# Patient Record
Sex: Female | Born: 1945 | Race: White | Hispanic: No | Marital: Married | Smoking: Former smoker
Health system: Southern US, Community
[De-identification: ages and names within clinical notes are randomized; demographics above are authoritative.]

## PROBLEM LIST (undated history)

## (undated) DIAGNOSIS — E785 Hyperlipidemia, unspecified: Secondary | ICD-10-CM

## (undated) DIAGNOSIS — Z9221 Personal history of antineoplastic chemotherapy: Secondary | ICD-10-CM

## (undated) DIAGNOSIS — C50919 Malignant neoplasm of unspecified site of unspecified female breast: Secondary | ICD-10-CM

## (undated) DIAGNOSIS — Z923 Personal history of irradiation: Secondary | ICD-10-CM

## (undated) DIAGNOSIS — IMO0002 Reserved for concepts with insufficient information to code with codable children: Secondary | ICD-10-CM

## (undated) DIAGNOSIS — K219 Gastro-esophageal reflux disease without esophagitis: Secondary | ICD-10-CM

## (undated) DIAGNOSIS — K259 Gastric ulcer, unspecified as acute or chronic, without hemorrhage or perforation: Secondary | ICD-10-CM

## (undated) DIAGNOSIS — E039 Hypothyroidism, unspecified: Secondary | ICD-10-CM

## (undated) HISTORY — PX: COLONOSCOPY: SHX174

## (undated) HISTORY — PX: CHOLECYSTECTOMY: SHX55

## (undated) HISTORY — PX: HEMORRHOID SURGERY: SHX153

## (undated) HISTORY — PX: ESOPHAGOGASTRODUODENOSCOPY: SHX1529

## (undated) HISTORY — PX: BREAST LUMPECTOMY: SHX2

## (undated) HISTORY — PX: CARPAL TUNNEL RELEASE: SHX101

## (undated) HISTORY — PX: WRIST FRACTURE SURGERY: SHX121

---

## 1999-10-29 DIAGNOSIS — C50919 Malignant neoplasm of unspecified site of unspecified female breast: Secondary | ICD-10-CM

## 1999-10-29 DIAGNOSIS — Z9221 Personal history of antineoplastic chemotherapy: Secondary | ICD-10-CM

## 1999-10-29 DIAGNOSIS — IMO0001 Reserved for inherently not codable concepts without codable children: Secondary | ICD-10-CM

## 1999-10-29 HISTORY — DX: Personal history of antineoplastic chemotherapy: Z92.21

## 1999-10-29 HISTORY — DX: Malignant neoplasm of unspecified site of unspecified female breast: C50.919

## 1999-10-29 HISTORY — DX: Reserved for inherently not codable concepts without codable children: IMO0001

## 2004-07-28 ENCOUNTER — Ambulatory Visit: Payer: Self-pay | Admitting: Oncology

## 2004-08-03 ENCOUNTER — Ambulatory Visit: Payer: Self-pay | Admitting: Gastroenterology

## 2004-08-10 ENCOUNTER — Ambulatory Visit: Payer: Self-pay | Admitting: General Surgery

## 2004-08-23 ENCOUNTER — Ambulatory Visit: Payer: Self-pay | Admitting: General Surgery

## 2004-10-01 ENCOUNTER — Ambulatory Visit: Payer: Self-pay | Admitting: Internal Medicine

## 2004-10-11 ENCOUNTER — Ambulatory Visit: Payer: Self-pay | Admitting: Internal Medicine

## 2009-02-15 ENCOUNTER — Ambulatory Visit: Payer: Self-pay | Admitting: Internal Medicine

## 2009-03-16 ENCOUNTER — Emergency Department: Payer: Self-pay | Admitting: Emergency Medicine

## 2010-06-19 ENCOUNTER — Ambulatory Visit: Payer: Self-pay | Admitting: Internal Medicine

## 2011-11-20 ENCOUNTER — Encounter: Payer: Self-pay | Admitting: Physician Assistant

## 2011-11-29 ENCOUNTER — Encounter: Payer: Self-pay | Admitting: Physician Assistant

## 2011-12-27 ENCOUNTER — Encounter: Payer: Self-pay | Admitting: Physician Assistant

## 2012-01-08 ENCOUNTER — Ambulatory Visit: Payer: Self-pay | Admitting: Unknown Physician Specialty

## 2012-06-15 ENCOUNTER — Ambulatory Visit: Payer: Self-pay | Admitting: Unknown Physician Specialty

## 2012-10-27 ENCOUNTER — Ambulatory Visit: Payer: Self-pay | Admitting: Family Medicine

## 2013-04-08 ENCOUNTER — Ambulatory Visit: Payer: Self-pay | Admitting: Unknown Physician Specialty

## 2013-09-06 ENCOUNTER — Inpatient Hospital Stay: Payer: Self-pay | Admitting: Family Medicine

## 2013-09-06 LAB — IRON AND TIBC
Iron Bind.Cap.(Total): 329 ug/dL (ref 250–450)
Iron Saturation: 45 %
Iron: 147 ug/dL (ref 50–170)
Unbound Iron-Bind.Cap.: 182 ug/dL

## 2013-09-06 LAB — CBC
HCT: 27.8 % — ABNORMAL LOW (ref 35.0–47.0)
MCV: 88 fL (ref 80–100)
Platelet: 389 10*3/uL (ref 150–440)
RBC: 3.16 10*6/uL — ABNORMAL LOW (ref 3.80–5.20)
RDW: 13 % (ref 11.5–14.5)
WBC: 21.2 10*3/uL — ABNORMAL HIGH (ref 3.6–11.0)

## 2013-09-06 LAB — URINALYSIS, COMPLETE
Bilirubin,UR: NEGATIVE
Glucose,UR: NEGATIVE mg/dL (ref 0–75)
Ketone: NEGATIVE
Nitrite: NEGATIVE
Ph: 5 (ref 4.5–8.0)
RBC,UR: 5 /HPF (ref 0–5)
Specific Gravity: 1.023 (ref 1.003–1.030)
Squamous Epithelial: <1
WBC UR: 8 /HPF (ref 0–5)

## 2013-09-06 LAB — COMPREHENSIVE METABOLIC PANEL
Albumin: 3.3 g/dL — ABNORMAL LOW (ref 3.4–5.0)
Anion Gap: 5 — ABNORMAL LOW (ref 7–16)
Bilirubin,Total: 0.1 mg/dL — ABNORMAL LOW (ref 0.2–1.0)
Calcium, Total: 9.6 mg/dL (ref 8.5–10.1)
Chloride: 109 mmol/L — ABNORMAL HIGH (ref 98–107)
Co2: 23 mmol/L (ref 21–32)
EGFR (African American): >60
EGFR (Non-African Amer.): >60
SGOT(AST): 21 U/L (ref 15–37)
Total Protein: 6.2 g/dL — ABNORMAL LOW (ref 6.4–8.2)

## 2013-09-06 LAB — RETICULOCYTES: Absolute Retic Count: 0.1042 10*6/uL (ref 0.019–0.186)

## 2013-09-06 LAB — HEMOGLOBIN: HGB: 7.3 g/dL — ABNORMAL LOW (ref 12.0–16.0)

## 2013-09-07 LAB — BASIC METABOLIC PANEL
Anion Gap: 6 — ABNORMAL LOW (ref 7–16)
BUN: 18 mg/dL (ref 7–18)
Calcium, Total: 8.5 mg/dL (ref 8.5–10.1)
Chloride: 113 mmol/L — ABNORMAL HIGH (ref 98–107)
EGFR (African American): >60
Glucose: 92 mg/dL (ref 65–99)
Osmolality: 285 (ref 275–301)
Potassium: 3.5 mmol/L (ref 3.5–5.1)

## 2013-09-07 LAB — CBC WITH DIFFERENTIAL/PLATELET
Basophil #: 0.1 10*3/uL (ref 0.0–0.1)
Basophil %: 0.7 %
Basophil %: 0.5 %
Eosinophil #: 0.1 10*3/uL (ref 0.0–0.7)
Eosinophil #: 0 10*3/uL (ref 0.0–0.7)
Eosinophil %: 0.3 %
Eosinophil %: 0.3 %
HCT: 20.7 % — ABNORMAL LOW (ref 35.0–47.0)
HGB: 7 g/dL — ABNORMAL LOW (ref 12.0–16.0)
Lymphocyte #: 5.1 10*3/uL — ABNORMAL HIGH (ref 1.0–3.6)
Lymphocyte %: 25.4 %
Lymphocyte %: 30.2 %
MCH: 29.5 pg (ref 26.0–34.0)
MCH: 30.2 pg (ref 26.0–34.0)
MCHC: 34.2 g/dL (ref 32.0–36.0)
MCHC: 33.9 g/dL (ref 32.0–36.0)
Monocyte #: 1.3 x10 3/mm — ABNORMAL HIGH (ref 0.2–0.9)
Neutrophil #: 13.4 10*3/uL — ABNORMAL HIGH (ref 1.4–6.5)
Neutrophil #: 10.1 10*3/uL — ABNORMAL HIGH (ref 1.4–6.5)
Neutrophil %: 67.1 %
Neutrophil %: 63.9 %
Platelet: 212 10*3/uL (ref 150–440)
RBC: 2.32 10*6/uL — ABNORMAL LOW (ref 3.80–5.20)
RDW: 13.3 % (ref 11.5–14.5)
WBC: 20 10*3/uL — ABNORMAL HIGH (ref 3.6–11.0)
WBC: 15.8 10*3/uL — ABNORMAL HIGH (ref 3.6–11.0)

## 2013-09-07 LAB — HEMOGLOBIN: HGB: 7.7 g/dL — ABNORMAL LOW (ref 12.0–16.0)

## 2013-09-08 LAB — CBC WITH DIFFERENTIAL/PLATELET
Basophil #: 0 10*3/uL (ref 0.0–0.1)
Basophil %: 0.4 %
Eosinophil #: 0.2 10*3/uL (ref 0.0–0.7)
HCT: 20.5 % — ABNORMAL LOW (ref 35.0–47.0)
HGB: 7.4 g/dL — ABNORMAL LOW (ref 12.0–16.0)
MCHC: 36 g/dL (ref 32.0–36.0)
Monocyte #: 0.4 x10 3/mm (ref 0.2–0.9)
Neutrophil #: 5.7 10*3/uL (ref 1.4–6.5)
Neutrophil %: 55 %

## 2013-09-08 LAB — BASIC METABOLIC PANEL
BUN: 8 mg/dL (ref 7–18)
Calcium, Total: 8.4 mg/dL — ABNORMAL LOW (ref 8.5–10.1)
Chloride: 110 mmol/L — ABNORMAL HIGH (ref 98–107)
Co2: 25 mmol/L (ref 21–32)
Creatinine: 0.57 mg/dL — ABNORMAL LOW (ref 0.60–1.30)
EGFR (African American): >60
Glucose: 93 mg/dL (ref 65–99)
Osmolality: 279 (ref 275–301)
Potassium: 3.3 mmol/L — ABNORMAL LOW (ref 3.5–5.1)
Sodium: 141 mmol/L (ref 136–145)

## 2013-09-08 LAB — HEMOGLOBIN: HGB: 7.7 g/dL — ABNORMAL LOW (ref 12.0–16.0)

## 2013-09-09 LAB — CBC WITH DIFFERENTIAL/PLATELET
Basophil %: 0.5 %
Eosinophil #: 0.2 10*3/uL (ref 0.0–0.7)
Eosinophil %: 1.9 %
Lymphocyte #: 2.7 10*3/uL (ref 1.0–3.6)
Lymphocyte %: 26.4 %
MCV: 89 fL (ref 80–100)
Monocyte %: 6 %
Neutrophil %: 65.2 %
Platelet: 258 10*3/uL (ref 150–440)
RDW: 13.4 % (ref 11.5–14.5)
WBC: 10.3 10*3/uL (ref 3.6–11.0)

## 2013-11-25 ENCOUNTER — Ambulatory Visit: Payer: Self-pay | Admitting: Gastroenterology

## 2015-02-17 NOTE — Consult Note (Signed)
Chief Complaint:  Subjective/Chief Complaint Pt denies any abdominal pain, nausea or vomiting.  No melena.  "I am hungry".  2nd unit PRBCs ordered for today.  Hgb 7.   VITAL SIGNS/ANCILLARY NOTES: **Vital Signs.:   11-Nov-14 05:44  Vital Signs Type Routine  Temperature Temperature (F) 98.2  Celsius 36.7  Temperature Source oral  Pulse Pulse 103  Respirations Respirations 17  Systolic BP Systolic BP 384  Diastolic BP (mmHg) Diastolic BP (mmHg) 67  Mean BP 82  Pulse Ox % Pulse Ox % 99  Pulse Ox Activity Level  At rest  Oxygen Delivery Room Air/ 21 %   Brief Assessment:  GEN well developed, no acute distress, obese, A/Ox3.  Husband at bedside.   Cardiac Regular   Respiratory normal resp effort   Gastrointestinal Normal   Gastrointestinal details normal Soft  Nontender  Nondistended  Bowel sounds normal  No rebound tenderness  No gaurding   EXTR negative cyanosis/clubbing, Trace PT edema bilat   Additional Physical Exam Skin: warm, pale, dry HEENT: conjunctivae pale   Lab Results: Routine Chem:  11-Nov-14 04:27   Glucose, Serum 92  BUN 18  Creatinine (comp)  0.54  Sodium, Serum 142  Chloride, Serum  113  CO2, Serum 23  Calcium (Total), Serum 8.5  Anion Gap  6  Osmolality (calc) 285  eGFR (African American) >60  eGFR (Non-African American) >60 (eGFR values <12m/min/1.73 m2 may be an indication of chronic kidney disease (CKD). Calculated eGFR is useful in patients with stable renal function. The eGFR calculation will not be reliable in acutely ill patients when serum creatinine is changing rapidly. It is not useful in  patients on dialysis. The eGFR calculation may not be applicable to patients at the low and high extremes of body sizes, pregnant women, and vegetarians.)  Routine Hem:  11-Nov-14 00:47   WBC (CBC)  20.0  RBC (CBC)  2.51  Hemoglobin (CBC)  7.4  Hematocrit (CBC)  21.7  Platelet Count (CBC)  107  MCV 86  MCH 29.5  MCHC 34.2  RDW 13.0   Neutrophil % 67.1  Lymphocyte % 25.4  Monocyte % 6.5  Eosinophil % 0.3  Basophil % 0.7  Neutrophil #  13.4  Lymphocyte #  5.1  Monocyte #  1.3  Eosinophil # 0.1  Basophil # 0.1 (Result(s) reported on 07 Sep 2013 at 03:44AM.)    04:27   WBC (CBC)  15.8  RBC (CBC)  2.32  Hemoglobin (CBC)  7.0  Hematocrit (CBC)  20.7  Platelet Count (CBC) 212  MCV 89  MCH 30.2  MCHC 33.9  RDW 13.3  Neutrophil % 63.9  Lymphocyte % 30.2  Monocyte % 5.1  Eosinophil % 0.3  Basophil % 0.5  Neutrophil #  10.1  Lymphocyte #  4.8  Monocyte # 0.8  Eosinophil # 0.0  Basophil # 0.1 (Result(s) reported on 07 Sep 2013 at 06:22AM.)   Assessment/Plan:  Assessment/Plan:  Assessment Upper GI bleed secondary to PUD w/visible vessel s/p injection & thermal treatment:  No further melena. Anemia: Secondary to #1.  Hgb 7 s/p 1 unit PRBCS, 2nd unit ordered Hx Narcotic -induced consitpation:  Educated patient on inappropriate NSAID use.  Pt may continue to use hydrocodone for fracture pain at home with BID colace 1037m& miralax 17 grams daily PRN constipation once discharged. Hx adenomatous colon polyps/FH colon cancer: outpatient colonoscopy   Plan 1) agree with transfusion 2) monitor H/H 3) Continue BID PPI for 3 months,can change to  PO if able to tolerate clears 4) Adv diet to clears, advance to low fat, low chol 5) Miralax 17 grams daily PRN & colace 194m BID if pt will go home on PO narcotics to prevent constipation 6) we will arrange outpatient colonoscopy with Dr WAllen NorrisPlease call if you have any questions or concerns   Electronic Signatures: JAndria Meuse(NP)  (Signed 11-Nov-14 12:06)  Authored: Chief Complaint, VITAL SIGNS/ANCILLARY NOTES, Brief Assessment, Lab Results, Assessment/Plan   Last Updated: 11-Nov-14 12:06 by JAndria Meuse(NP)

## 2015-02-17 NOTE — Consult Note (Signed)
Chief Complaint:  Subjective/Chief Complaint Pt denies any abdominal pain, nausea or vomiting.  No melena. No dizziness, SOB, palpitations or pre-syncopal episodes.  Ambulating well.  Hgb stable.   VITAL SIGNS/ANCILLARY NOTES: **Vital Signs.:   14-Nov-14 06:11  Vital Signs Type Routine  Temperature Temperature (F) 97.9  Celsius 36.6  Temperature Source oral  Pulse Pulse 67  Respirations Respirations 18  Systolic BP Systolic BP 99  Diastolic BP (mmHg) Diastolic BP (mmHg) 61  Mean BP 73  Pulse Ox % Pulse Ox % 97  Pulse Ox Activity Level  At rest  Oxygen Delivery Room Air/ 21 %   Brief Assessment:  GEN well developed, no acute distress, obese, A/Ox3.  husband at bedside.   Cardiac Regular   Respiratory normal resp effort   Gastrointestinal Normal   Gastrointestinal details normal Soft  Nontender  Nondistended  Bowel sounds normal  No rebound tenderness  No gaurding   EXTR negative cyanosis/clubbing, Trace PT edema bilat/ Left FA elevated on pillow. with minimal hand edema.   Additional Physical Exam Skin: warm, pale, dry HEENT: conjunctivae pale   Lab Results: Routine Hem:  14-Nov-14 04:29   Hemoglobin (CBC)  7.2 (Result(s) reported on 10 Sep 2013 at 05:01AM.)   Assessment/Plan:  Assessment/Plan:  Assessment Upper GI bleed secondary to PUD: No further active bleeding. Anemia: Secondary to #1.  Hgb low but stable s/p 2 unit PRBCS.   Hx adenomatous colon polyps/FH colon cancer: outpatient colonoscopy   Plan 1) FU h pylori serology 2) BID PPI 3) continue supportive measures 4)FU OV with [place] in 2 weeks.  We wll set up EGD/colonoscopy with Dr Allen Norris Will sign off, Please call if you have any questions or concerns   Electronic Signatures: Andria Meuse (NP)  (Signed [phone] 09:08)  Authored: Chief Complaint, VITAL SIGNS/ANCILLARY NOTES, Brief Assessment, Lab Results, Assessment/Plan   Last Updated: 14-Nov-14 09:08 by Andria Meuse (NP)

## 2015-02-17 NOTE — H&P (Signed)
PATIENT NAME:  Faith Garrett, Faith Garrett MR#:  [id number] DATE OF BIRTH:  12/01/1945  DATE OF ADMISSION:  09/06/2013   PRIMARY CARE PHYSICIAN: Dr. Netty Starring.   CHIEF COMPLAINT: Dizziness, presyncope and loose stools.   HISTORY OF PRESENT ILLNESS: This is a 69 year old female who presented to the hospital due to a presyncopal episode while having a bowel movement earlier today. The patient apparently broke her left wrist 2 weeks ago. She has been taking some oxycodone for her pain on the left wrist. She had been constipated so she started eating an increasing amount of prunes to help her with her constipation. She has been having some formed stools, but frequent over the past 2 days. She has had about 4 to 5 bowel movement in the past 2 days. They have been nonbloody. They have not been watery. She said she has been feeling kind of dizzy and lightheaded since she has been having these increasing bowel movements. This morning, when she was attempting to have a bowel movement, she had a significant episode of lightheadedness where she could not get up from the toilet, and she herself to the ground. Husband called EMS, and she was brought to the hospital. The patient denied any chest pain, any shortness of breath, any nausea or any vomiting, any abdominal pain, any fevers, chills, or any other associated symptoms presently.   REVIEW OF SYSTEMS:  CONSTITUTIONAL: No documented fever. No weight gain. No weight loss.  EYES: No blurry or double vision.  ENT: No tinnitus. No postnasal drip. No redness of the oropharynx.  RESPIRATORY: No cough. No wheeze. No hemoptysis. No dyspnea.  CARDIOVASCULAR: No chest pain. No orthopnea. No palpitations. Positive presyncope.  GASTROINTESTINAL: No nausea. No vomiting. No diarrhea. No abdominal pain. No melena or hematochezia.  GENITOURINARY: No dysuria or hematuria.  ENDOCRINE: No polyuria or nocturia. No heat or cold intolerance.  HEMATOLOGIC: Positive anemia. No acute bruising or  bleeding.  INTEGUMENTARY: No rashes. No lesions.  MUSCULOSKELETAL: No arthritis. No swelling. No gout.  NEUROLOGIC: No numbness or tingling. No ataxia. No seizure-type activity.  PSYCHIATRIC: No anxiety. No insomnia. No ADD.   PAST MEDICAL HISTORY: Consistent with hypothyroidism, history of breast cancer, hyperlipidemia.   ALLERGIES: CODEINE, WHICH CAUSES ITCHING.   SOCIAL HISTORY: No smoking. No alcohol abuse. No illicit drug abuse. Lives at home with her husband.   PAST SURGICAL HISTORY:  Consistent with hemorrhoidectomy, left breast lumpectomy, cholecystectomy.   FAMILY HISTORY: Both mother and father are deceased. Both died from complications of heart disease.   CURRENT MEDICATIONS: As follows, aspirin 325 mg daily, lovastatin 40 mg bedtime, Nasonex 1 spray to both nostrils b.i.d., Synthroid 137 mcg every day except Saturday and Sunday, Synthroid 150 mcg on Saturday and Sunday.   PHYSICAL EXAMINATION: Presently is as follows: VITAL SIGNS:  Are noted to be: Temperature is 98.1. Pulse 96, respirations 20, blood pressure 128/59, sats 98% on room air.  GENERAL: She is a pleasant-appearing female, in no apparent distress.  HEENT: She is atraumatic, normocephalic. Her extraocular muscles are intact. Pupils equal and reactive to light. Sclerae anicteric. No conjunctival injection. No pharyngeal erythema.  NECK: Supple. There is no jugular venous distention. No bruits. No lymphadenopathy or thyromegaly.  HEART: Regular rate and rhythm. No murmurs. No rubs. No clicks.  LUNGS: Clear to auscultation bilaterally. No rales. No rhonchi. No wheezes.  ABDOMEN: Soft, flat, nontender, nondistended. Has good bowel sounds. No hepatomegaly/splenomegaly appreciated.  EXTREMITIES: No evidence of any cyanosis, clubbing or peripheral edema. Has +2  pedal and radial pulses bilaterally. The patient has a left wrist cast and a splint.  NEUROLOGICAL: The patient is alert, awake, oriented x 3 with no focal motor or  sensory deficits appreciated bilaterally.  SKIN: Moist, warm with no rashes appreciated.  LYMPHATIC: There is no cervical or axillary lymphadenopathy.    LABORATORY DATA: Showed a serum glucose of 143, BUN 52, creatinine 0.6, sodium 137, potassium 4.1, chloride 109, bicarb 23. The patient's LFTs are within normal limits. Troponin less than 0.02. White cell count 21.2, hemoglobin 9.6, hematocrit 27.8, blood count of 389. Urinalysis is within normal limits.   ASSESSMENT AND PLAN: This is a 69 year old female with history of hypothyroidism, hyperlipidemia, recent left wrist fracture, history of breast cancer, presents to the hospital due to a presyncopal episode and also having multiple bowel movements.  1.  Presyncope. I suspect this is probably related to dehydration and volume loss from her frequent bowel movements. We will gently hydrate her with IV fluids and follow her clinically. I will go ahead and recheck her orthostatic vital signs in the morning. I do not appreciate any evidence of other focal metabolic or infectious etiology presently.  2.  Leukocytosis. The patient's white cell count up to 21,000. The exact source of this is unclear. I suspect this is probably stress margination. The patient's urinalysis is negative. Her chest x-ray is negative. I will follow her white cell count in the morning. If it persistently stays up, consider working her up with possible CT scan or further imaging.  3.  Hypothyroidism. Continue Synthroid.  4.  Hyperlipidemia. Continue lovastatin.  5.  Anemia. This a normocytic anemia. Previous hemoglobin in 2010 was normal. The patient does not have any evidence of acute blood loss. I will check iron and ferritin levels and a reticulocyte count for now.   The patient is a FULL CODE.   TIME SPENT: 50 minutes.   The patient will be transferred over to Dr. Raylene Miyamoto service.    ____________________________ Belia Heman. Verdell Carmine, MD vjs:dmm D: 09/06/2013 12:24:06  ET T: 09/06/2013 13:03:35 ET JOB#: [id number]  cc: Belia Heman. Verdell Carmine, MD, <Dictator> Henreitta Leber MD ELECTRONICALLY SIGNED 10/02/2013 18:36

## 2015-02-17 NOTE — Consult Note (Signed)
Chief Complaint:  Subjective/Chief Complaint Pt denies any abdominal pain, nausea or vomiting.  No melena.  Tolerated regular diet this morning well.  Hgb 7.4.   VITAL SIGNS/ANCILLARY NOTES: **Vital Signs.:   12-Nov-14 06:44  Temperature Temperature (F) 97.6  Celsius 36.4  Temperature Source oral  Pulse Pulse 76  Respirations Respirations 18  Systolic BP Systolic BP 314  Diastolic BP (mmHg) Diastolic BP (mmHg) 60  Mean BP 78  Pulse Ox % Pulse Ox % 98  Pulse Ox Activity Level  At rest  Oxygen Delivery Room Air/ 21 %   Brief Assessment:  GEN well developed, no acute distress, obese, A/Ox3.   Cardiac Regular   Respiratory normal resp effort   Gastrointestinal Normal   Gastrointestinal details normal Soft  Nontender  Nondistended  Bowel sounds normal  No rebound tenderness  No gaurding   EXTR negative cyanosis/clubbing, Trace PT edema bilat   Additional Physical Exam Skin: warm, pale, dry HEENT: conjunctivae pale   Lab Results: Routine Chem:  12-Nov-14 04:44   Glucose, Serum 93  BUN 8  Creatinine (comp)  0.57  Sodium, Serum 141  Potassium, Serum  3.3  Chloride, Serum  110  CO2, Serum 25  Calcium (Total), Serum  8.4  Anion Gap  6  Osmolality (calc) 279  eGFR (African American) >60  eGFR (Non-African American) >60 (eGFR values <75m/min/1.73 m2 may be an indication of chronic kidney disease (CKD). Calculated eGFR is useful in patients with stable renal function. The eGFR calculation will not be reliable in acutely ill patients when serum creatinine is changing rapidly. It is not useful in  patients on dialysis. The eGFR calculation may not be applicable to patients at the low and high extremes of body sizes, pregnant women, and vegetarians.)  Routine Hem:  12-Nov-14 04:44   WBC (CBC) 10.4  RBC (CBC)  2.37  Hemoglobin (CBC)  7.4  Hematocrit (CBC)  20.5  Platelet Count (CBC) 218  MCV 87  MCH 31.1  MCHC 36.0  RDW 13.6  Neutrophil % 55.0  Lymphocyte % 38.7   Monocyte % 4.2  Eosinophil % 1.7  Basophil % 0.4  Neutrophil # 5.7  Lymphocyte #  4.0  Monocyte # 0.4  Eosinophil # 0.2  Basophil # 0.0 (Result(s) reported on 08 Sep 2013 at 05:22AM.)   Assessment/Plan:  Assessment/Plan:  Assessment Upper GI bleed secondary to PUD w/visible vessel s/p injection & thermal treatment:  No further melena. Anemia: Secondary to #1.  Hgb 7.4 s/p 2 unit PRBCS.  She has not had appropriate transfusion response, but shows no signs of active hemorrhaging.  Hopefully, this is just equilibration from previous bleeding.  Hgb recheck at 3pm.  If continued drop, we may need to consider EGD to look for rebleeding. Hx adenomatous colon polyps/FH colon cancer: outpatient colonoscopy   Plan 1) FU 1500 H/H 2) BID PPI 3) continue supportive measures 4) we will arrange outpatient colonoscopy with Dr WAllen NorrisPlease call if you have any questions or concerns   Electronic Signatures: JAndria Meuse(NP)  (Signed [phone] [id number])  Authored: Chief Complaint, VITAL SIGNS/ANCILLARY NOTES, Brief Assessment, Lab Results, Assessment/Plan   Last Updated: 12-Nov-14 10:42 by JAndria Meuse(NP)

## 2015-02-17 NOTE — Consult Note (Signed)
Brief Consult Note: Diagnosis: melena, anemia.   Patient was seen by consultant.   Consult note dictated.   Comments: Ms. Belleville is a pleasant 70 y/o female with recent left wrist fx taking significant amounts of Aspirin & Ibuprofen for pain who developed melena.  She has normocytic anemia with hgb 9.6.  I suspect UGI bleed such as PUD.  She is also overdue for colonoscopy for hx of adenomatous polyps & family hx of colon cancer in her mother.  Plan: 1) NPO 2) EGD with Dr Allen Norris now 3) agree with protonix 40mg  IV BID 4) colonosocopy at a later date 74) Avoid NSAIDs  Thanks for consult.  Please see full dictated note. #[id number].  Electronic Signatures: Andria Meuse (NP)  (Signed [phone] 17:02)  Authored: Brief Consult Note   Last Updated: 10-Nov-14 17:02 by Andria Meuse (NP)

## 2015-02-17 NOTE — Discharge Summary (Signed)
PATIENT NAME:  Faith Garrett, Faith Garrett MR#:  [id number] DATE OF BIRTH:  12-09-1945  DATE OF ADMISSION:  09/06/2013 DATE OF DISCHARGE:  09/10/2013  DISCHARGE DIAGNOSES: 1.  Acute anemia secondary to upper gastrointestinal bleed.  2.  Presyncope secondary to anemia.  3.  Hypothyroidism.  4.  Hyperlipidemia.   DISCHARGE MEDICATIONS: 1.  Synthroid, as directed per home regimen.  2.  Lovastatin 40 mg p.o. at bedtime.  3.  Nasonex 50 mcg 2 sprays each nostril daily.  4.  MiraLAX 17 grams p.o. daily as needed for constipation.  5.  Colace 100 mg p.o. b.i.d. as needed for constipation.  6.  Pantoprazole 40 mg p.o. b.i.d.; that prescription will be electronically sent.   CONSULTS: GI.   PROCEDURES: The patient underwent an upper endoscopy that showed gastric ulcers that did require thermal therapy.   PERTINENT LABS ON DISCHARGE: Hemoglobin was 7.2.   BRIEF HOSPITAL COURSE:  1.  Presyncope. The patient initially came in with a presyncopal episode during a BM thought to be secondary to vasovagal versus acute anemia, more likely her acute anemia, due to upper GI bleed.  2.  Acute anemia. The patient initially came in with a hemoglobin of 9.6 with melanotic stools. She was transfused 2 units. Did not have any further bleeding episodes. Did notice old blood in the stool. Hemoglobin remained stable at 7.2 x 48 hours; therefore, after discussion with GI and the patient, I felt that the patient was stable enough to be discharged to home.   Will follow up with a colonoscopy as an outpatient in 3 weeks. We will reassess her hemoglobin on Monday. I gave the patient red flag symptoms of when to follow up in the walk-in clinic or the ER over the weekend if she were to be symptomatic from the anemia.   DISPOSITION: She is in stable condition and will be discharged to home. Follow up with Dr. Netty Starring within 14 days. Follow up for CBC on Monday.   ____________________________ Dion Body,  MD kl:dmm D: 09/10/2013 08:11:01 ET T: 09/10/2013 09:54:57 ET JOB#: [id number]  cc: Dion Body, MD, <Dictator> Dion Body MD ELECTRONICALLY SIGNED 10/04/2013 9:56

## 2015-02-17 NOTE — Consult Note (Signed)
Chief Complaint:  Subjective/Chief Complaint Pt denies any abdominal pain, nausea or vomiting.  No melena.  Tolerated regular diet this morning well.  Hgb stable.   VITAL SIGNS/ANCILLARY NOTES: **Vital Signs.:   13-Nov-14 06:05  Vital Signs Type Routine  Temperature Temperature (F) 98.2  Celsius 36.7  Temperature Source oral  Pulse Pulse 81  Respirations Respirations 18  Systolic BP Systolic BP 159  Diastolic BP (mmHg) Diastolic BP (mmHg) 74  Mean BP 89  Pulse Ox % Pulse Ox % 95  Pulse Ox Activity Level  At rest  Oxygen Delivery Room Air/ 21 %   Brief Assessment:  GEN well developed, no acute distress, obese, A/Ox3.  husband at bedside.   Cardiac Regular   Respiratory normal resp effort   Gastrointestinal Normal   Gastrointestinal details normal Soft  Nontender  Nondistended  Bowel sounds normal  No rebound tenderness  No gaurding   EXTR negative cyanosis/clubbing, Trace PT edema bilat/ Left FA elevated on pillow. with minimal hand edema.   Additional Physical Exam Skin: warm, pale, dry HEENT: conjunctivae pale   Lab Results: Routine Hem:  13-Nov-14 04:56   WBC (CBC) 10.3  RBC (CBC)  2.36  Hemoglobin (CBC)  7.2  Hematocrit (CBC)  20.9  Platelet Count (CBC) 258  MCV 89  MCH 30.4  MCHC 34.2  RDW 13.4  Neutrophil % 65.2  Lymphocyte % 26.4  Monocyte % 6.0  Eosinophil % 1.9  Basophil % 0.5  Neutrophil #  6.7  Lymphocyte # 2.7  Monocyte # 0.6  Eosinophil # 0.2  Basophil # 0.1 (Result(s) reported on 09 Sep 2013 at 05:55AM.)   Assessment/Plan:  Assessment/Plan:  Assessment Upper GI bleed secondary to PUD: No further active bleeding. Anemia: Secondary to #1.  Hgb low but stable s/p 2 unit PRBCS.   Hx adenomatous colon polyps/FH colon cancer: outpatient colonoscopy   Plan 1) h pylori serology 2) BID PPI 3) continue supportive measures 4) we will arrange outpatient colonoscopy/follow up EGD  with Dr Allen Norris in 3 months Please call if you have any questions or  concerns   Electronic Signatures: Andria Meuse (NP)  (Signed [phone] 11:50)  Authored: Chief Complaint, VITAL SIGNS/ANCILLARY NOTES, Brief Assessment, Lab Results, Assessment/Plan   Last Updated: 13-Nov-14 11:50 by Andria Meuse (NP)

## 2015-02-17 NOTE — Consult Note (Signed)
PATIENT NAME:  Faith Garrett, Faith Garrett MR#:  [id number] DATE OF BIRTH:  1945/12/15  DATE OF CONSULTATION:  09/06/2013  REFERRING PHYSICIAN:  Vivek J. Verdell Carmine, MD CONSULTING PHYSICIAN:  Lucilla Lame, MD; Andria Meuse, NP PRIMARY CARE PHYSICIAN: Dion Body, MD  REASON FOR CONSULTATION: Upper GI bleed, melena, anemia,   HISTORY OF PRESENT ILLNESS: Faith Garrett is a 69 year old Caucasian female who was in her usual state of health until she had a fall while at a funeral approximately 2 weeks ago and fractured her left wrist. She has been followed by Dr. Leanor Kail. Last week, approximately 5 days ago, she developed constipation. She felt this was due to the hydrocodone that she was taking. She began to start eating prunes to help with her constipation. She notes her stools became darker and darker. On Sunday at 1:00 in the afternoon, she had a presyncopal  episode with bowel movement. She did make it back to the bed and the cold bed made her feel better. She describes the bowel movement as soft, and they were formed. She then began to have bowel movements about every 4 hours. With each bowel movement, she would have diaphoresis, dizziness. She would become clammy and she noticed melena. She denies any nausea or vomiting. She has had heartburn and indigestion, chronically has been taking over-the-counter antacids in the morning, usually Pepcid. She has been taking ibuprofen 400 mg, followed an hour and a half later by two 500 mg aspirins, followed an hour and a half later by 2 Tylenol. She was repeating this cycle every hour and a half. She has stopped hydrocodone because she felt it was causing her constipation. She denies any history of peptic ulcer disease or GI bleeding. She has never had an EGD. She denies abdominal pain other than the heartburn. She denies any dysphagia or odynophagia. Last colonoscopy was in 2005 and she believes she had adenomatous polyps, although path report not available at this time.  She did not follow up with colonoscopy, as Dr. Sonny Masters was no longer practicing, and she did not establish care with a new GI physician. She does have a family history of colon cancer in her mother. She last ate and drank at 6:00 p.m. yesterday. No history of blood transfusions.   PAST MEDICAL AND SURGICAL HISTORY: Left wrist fracture, right elbow fracture, hypothyroidism, left breast cancer status post lumpectomy and chemotherapy, hyperlipidemia, adenomatous colon polyps on colonoscopy 2005 by Dr. Sonny Masters, hemorrhoidectomy, cholecystectomy.   MEDICATIONS PRIOR TO ADMISSION: Aspirin 500 mg every 5 hours p.r.n., lovastatin 40 mg at bedtime, Nasonex 50 mcg b.i.d., Synthroid 137 mcg daily except Saturday and Sunday, Synthroid 150 mcg on Saturday and Sunday, Tylenol 500 mg p.r.n., hydrocodone p.r.n., potassium 99 mg daily, Allegra or Zyrtec p.r.n.   ALLERGIES: CODEINE CAUSES ITCHING, ZOMAX CAUSED ANAPHYLAXIS.   FAMILY HISTORY: Positive for mother diagnosed with colon cancer at age 62. Father had an arrhythmia.   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 She has 3 grown healthy children. She lives with her husband. She denies any tobacco, alcohol or illicit drug use.   REVIEW OF SYSTEMS: See HPI.  MUSCULOSKELETAL: She is having left forearm, left wrist pain.   Otherwise negative complete 12-point review of systems.   PHYSICAL EXAMINATION: VITAL SIGNS: Temperature 98.2, pulse 102, respirations 18, blood pressure 116/73, O2 sat 98% on room air.  GENERAL: She is a well-developed, well-nourished Caucasian female in no acute distress. Her husband and daughter are at the bedside.  HEENT: Sclerae clear, anicteric. Conjunctivae pale. Oropharynx pink and moist.  NECK: Supple without any mass or thyromegaly.  CHEST: Heart rate with a mild tachycardia.  LUNGS: Clear to auscultation bilaterally.  ABDOMEN: Protuberant with positive bowel sounds x 4. No bruits auscultated. Abdomen is soft,  nontender, nondistended without palpable mass or hepatosplenomegaly. Exam is limited given patient's body habitus.  EXTREMITIES: She has a splint to her left wrist and forearm. It is elevated. She has mild swelling. No rash or erythema. No edema or clubbing.  SKIN: Pink, warm and dry without any rash or jaundice.  PSYCHIATRIC: She is alert, pleasant, cooperative. Normal mood and affect.  NEUROLOGIC: Grossly intact.   LABORATORY STUDIES: Glucose 143, BUN 52, chloride 109, otherwise normal met-7. Iron sat 45%, TIBC 329, UIBC 182. Total protein 6.2, albumin 3.3, total bilirubin 0.1, alkaline phosphatase 128, AST 21, ALT 33. Troponin was negative. White blood cell count 21.2, hemoglobin 9.6, down to 7.3 at 1530, platelets normal at 389.   IMPRESSION: Faith Garrett is a pleasant 69 year old female with a recent left wrist fracture, taking significant amounts of aspirin and ibuprofen for pain, who developed melena. She has a normocytic anemia with a hemoglobin of 9.6 that has since dropped since admission to 7.3. Her ferritin is normal. Given her melena, I suspect she has upper gastrointestinal bleed from nonsteroidal antiinflammatory drug use, such as peptic ulcer disease. She will have an EGD with Dr. Allen Norris this afternoon. She is also overdue for colonoscopy for history of adenomatous polyps and family history of colon cancer in her mother.   PLAN: 1.  N.p.o.  2.  EGD with Dr. Allen Norris today. I have discussed risks and benefits which include, but are not limited to bleeding, infection, perforation or drug reaction. She agrees with the plan. Consent will be obtained.  3.  Agree with Protonix 40 mg IV b.i.d.  4.  Colonoscopy at a later date.  5.  Avoid NSAIDs.  We would like to thank you for allowing [place] to participate in the care of Faith Garrett.  ____________________________ Andria Meuse, NP klj:jcm D: 09/06/2013 17:01:46 ET T: 09/06/2013 17:47:00 ET JOB#: [id number]  cc: Andria Meuse, NP,  <Dictator> Dion Body, MD Andria Meuse FNP ELECTRONICALLY SIGNED 10/04/2013 15:27

## 2015-02-19 NOTE — Op Note (Signed)
PATIENT NAME:  Faith Garrett, Faith Garrett MR#:  [id number] DATE OF BIRTH:  1946/10/28  DATE OF PROCEDURE:  01/08/2012  PREOPERATIVE DIAGNOSIS: Carpal tunnel syndrome, right wrist.   POSTOPERATIVE DIAGNOSIS: Carpal tunnel syndrome, right wrist.    OPERATION: Open carpal tunnel release on the right.   SURGEON: Vilinda Flake. Brooke Bonito., MD   ANESTHESIA: General.   HISTORY: The patient had a fairly long history of progressive carpal tunnel symptoms referable to her right wrist. Recently she had had nerve conduction studies which revealed severe carpal tunnel syndrome on the right. She had been wearing a wrist splint which had not been very effective in ameliorating her symptoms. Ultimately she was brought in for carpal tunnel release on the right.   PROCEDURE: The patient was taken to the operating room where a tourniquet was applied to the right upper arm and then satisfactory general anesthesia was achieved. The right upper extremity was prepped and draped in the usual fashion for a procedure about the wrist.   Next, the right upper extremity was exsanguinated and the tourniquet was inflated. A slightly curved incision was made over the volar aspect of the distal half of the carpal tunnel. Dissection was carried down through subcutaneous tissue onto the palmar fascia. The distal edge of the transverse carpal ligament was identified and then a Kelly clamp was inserted underneath the ligament to protect the median nerve and then the ligament was divided in its entirety. The distal half was divided with a knife and the proximal half with scissors. Inspection of the nerve subsequent to the release of the ligament revealed that the portion of the nerve underneath the ligament was quite compressed.   No mass was appreciated within the carpal tunnel. There was no evidence for proliferative synovitis.   I then infiltrated the incision with 4 to 5 mL of 0.5% Marcaine with epinephrine. Tourniquet was released. It was up  about 10 minutes. Bleeding was controlled with digital pressure and coagulation cautery. The wound was irrigated with GU irrigant. The skin was closed with 4-0 nylon in vertical mattress fashion. Betadine was applied to the wound followed by a sterile dressing and a fiberglass volar splint was applied with the wrist slightly dorsiflexed.   The patient was then awakened and transferred to her stretcher bed. She was taken to the recovery room in satisfactory condition. Blood loss was negligible.   ____________________________ Kathrene Alu., MD hbk:drc D: 01/08/2012 08:46:43 ET T: 01/08/2012 13:06:57 ET JOB#: [id number]  cc: Kathrene Alu., MD, <Dictator> Vilinda Flake, Brooke Bonito MD ELECTRONICALLY SIGNED 01/19/2012 18:13

## 2015-04-21 ENCOUNTER — Other Ambulatory Visit: Payer: Self-pay | Admitting: Family Medicine

## 2015-04-21 DIAGNOSIS — Z1231 Encounter for screening mammogram for malignant neoplasm of breast: Secondary | ICD-10-CM

## 2015-04-27 ENCOUNTER — Ambulatory Visit
Admission: RE | Admit: 2015-04-27 | Discharge: 2015-04-27 | Disposition: A | Discharge status: Home / Self Care | Payer: PPO | Source: Ambulatory Visit | Attending: Family Medicine | Admitting: Family Medicine

## 2015-04-27 DIAGNOSIS — Z1231 Encounter for screening mammogram for malignant neoplasm of breast: Secondary | ICD-10-CM | POA: Insufficient documentation

## 2015-04-27 HISTORY — DX: Reserved for concepts with insufficient information to code with codable children: IMO0002

## 2015-04-27 HISTORY — DX: Personal history of antineoplastic chemotherapy: Z92.21

## 2015-04-27 HISTORY — DX: Malignant neoplasm of unspecified site of unspecified female breast: C50.919

## 2015-05-15 ENCOUNTER — Encounter: Payer: Self-pay | Admitting: *Deleted

## 2015-05-26 ENCOUNTER — Encounter
Admission: RE | Disposition: A | Discharge status: Home / Self Care | Payer: Self-pay | Source: Ambulatory Visit | Attending: Unknown Physician Specialty

## 2015-05-26 ENCOUNTER — Ambulatory Visit: Payer: PPO | Admitting: Anesthesiology

## 2015-05-26 ENCOUNTER — Ambulatory Visit
Admission: RE | Admit: 2015-05-26 | Discharge: 2015-05-26 | Disposition: A | Discharge status: Home / Self Care | Payer: PPO | Source: Ambulatory Visit | Attending: Unknown Physician Specialty | Admitting: Unknown Physician Specialty

## 2015-05-26 ENCOUNTER — Encounter: Payer: Self-pay | Admitting: Anesthesiology

## 2015-05-26 DIAGNOSIS — M75101 Unspecified rotator cuff tear or rupture of right shoulder, not specified as traumatic: Secondary | ICD-10-CM | POA: Insufficient documentation

## 2015-05-26 DIAGNOSIS — Z885 Allergy status to narcotic agent status: Secondary | ICD-10-CM | POA: Insufficient documentation

## 2015-05-26 DIAGNOSIS — M7541 Impingement syndrome of right shoulder: Secondary | ICD-10-CM | POA: Insufficient documentation

## 2015-05-26 DIAGNOSIS — Z8711 Personal history of peptic ulcer disease: Secondary | ICD-10-CM | POA: Diagnosis not present

## 2015-05-26 DIAGNOSIS — Z9049 Acquired absence of other specified parts of digestive tract: Secondary | ICD-10-CM | POA: Insufficient documentation

## 2015-05-26 DIAGNOSIS — E669 Obesity, unspecified: Secondary | ICD-10-CM | POA: Insufficient documentation

## 2015-05-26 DIAGNOSIS — R1013 Epigastric pain: Secondary | ICD-10-CM | POA: Insufficient documentation

## 2015-05-26 DIAGNOSIS — Z87891 Personal history of nicotine dependence: Secondary | ICD-10-CM | POA: Insufficient documentation

## 2015-05-26 DIAGNOSIS — S46111A Strain of muscle, fascia and tendon of long head of biceps, right arm, initial encounter: Secondary | ICD-10-CM | POA: Diagnosis not present

## 2015-05-26 DIAGNOSIS — Z853 Personal history of malignant neoplasm of breast: Secondary | ICD-10-CM | POA: Insufficient documentation

## 2015-05-26 DIAGNOSIS — Z888 Allergy status to other drugs, medicaments and biological substances status: Secondary | ICD-10-CM | POA: Diagnosis not present

## 2015-05-26 DIAGNOSIS — L409 Psoriasis, unspecified: Secondary | ICD-10-CM | POA: Insufficient documentation

## 2015-05-26 DIAGNOSIS — E785 Hyperlipidemia, unspecified: Secondary | ICD-10-CM | POA: Insufficient documentation

## 2015-05-26 DIAGNOSIS — E039 Hypothyroidism, unspecified: Secondary | ICD-10-CM | POA: Diagnosis not present

## 2015-05-26 DIAGNOSIS — M25511 Pain in right shoulder: Secondary | ICD-10-CM | POA: Diagnosis present

## 2015-05-26 DIAGNOSIS — X58XXXA Exposure to other specified factors, initial encounter: Secondary | ICD-10-CM | POA: Insufficient documentation

## 2015-05-26 DIAGNOSIS — Z8249 Family history of ischemic heart disease and other diseases of the circulatory system: Secondary | ICD-10-CM | POA: Diagnosis not present

## 2015-05-26 DIAGNOSIS — Z79899 Other long term (current) drug therapy: Secondary | ICD-10-CM | POA: Insufficient documentation

## 2015-05-26 DIAGNOSIS — Z6834 Body mass index (BMI) 34.0-34.9, adult: Secondary | ICD-10-CM | POA: Diagnosis not present

## 2015-05-26 HISTORY — PX: SHOULDER ARTHROSCOPY WITH OPEN ROTATOR CUFF REPAIR: SHX6092

## 2015-05-26 HISTORY — DX: Hypothyroidism, unspecified: E03.9

## 2015-05-26 HISTORY — DX: Hyperlipidemia, unspecified: E78.5

## 2015-05-26 HISTORY — DX: Gastric ulcer, unspecified as acute or chronic, without hemorrhage or perforation: K25.9

## 2015-05-26 SURGERY — ARTHROSCOPY, SHOULDER WITH REPAIR, ROTATOR CUFF, OPEN
Anesthesia: General | Laterality: Right | Wound class: Clean

## 2015-05-26 MED ORDER — MIDAZOLAM HCL 2 MG/2ML IJ SOLN
INTRAMUSCULAR | Status: DC | PRN
  Administered 2015-05-26: 2 mg via INTRAVENOUS

## 2015-05-26 MED ORDER — ONDANSETRON HCL 4 MG/2ML IJ SOLN: 4.0000 mg | Freq: Once | INTRAMUSCULAR | Status: DC | PRN

## 2015-05-26 MED ORDER — ONDANSETRON HCL 4 MG/2ML IJ SOLN
INTRAMUSCULAR | Status: DC | PRN
  Administered 2015-05-26: 4 mg via INTRAVENOUS

## 2015-05-26 MED ORDER — LACTATED RINGERS IV SOLN
INTRAVENOUS | Status: DC
  Administered 2015-05-26: 09:00:00 via INTRAVENOUS

## 2015-05-26 MED ORDER — OXYCODONE HCL 5 MG/5ML PO SOLN: 5.0000 mg | Freq: Once | ORAL | Status: DC | PRN

## 2015-05-26 MED ORDER — HYDROMORPHONE HCL 1 MG/ML IJ SOLN: 0.2500 mg | INTRAMUSCULAR | Status: DC | PRN

## 2015-05-26 MED ORDER — EPINEPHRINE HCL 1 MG/ML IJ SOLN
INTRAMUSCULAR | Status: DC | PRN
  Administered 2015-05-26: 8000 mL

## 2015-05-26 MED ORDER — LIDOCAINE HCL (CARDIAC) 20 MG/ML IV SOLN
INTRAVENOUS | Status: DC | PRN
  Administered 2015-05-26: 40 mg via INTRATRACHEAL

## 2015-05-26 MED ORDER — DEXAMETHASONE SODIUM PHOSPHATE 4 MG/ML IJ SOLN
INTRAMUSCULAR | Status: DC | PRN
  Administered 2015-05-26: 8 mg via INTRAVENOUS
  Administered 2015-05-26: 4 mg via PERINEURAL

## 2015-05-26 MED ORDER — ROPIVACAINE HCL 5 MG/ML IJ SOLN
INTRAMUSCULAR | Status: DC | PRN
  Administered 2015-05-26: 200 mg via PERINEURAL

## 2015-05-26 MED ORDER — OXYCODONE HCL 5 MG PO TABS: 5.0000 mg | ORAL_TABLET | Freq: Once | ORAL | Status: DC | PRN

## 2015-05-26 MED ORDER — PROPOFOL 10 MG/ML IV BOLUS
INTRAVENOUS | Status: DC | PRN
  Administered 2015-05-26: 200 mg via INTRAVENOUS

## 2015-05-26 MED ORDER — FENTANYL CITRATE (PF) 100 MCG/2ML IJ SOLN
INTRAMUSCULAR | Status: DC | PRN
  Administered 2015-05-26: 100 ug via INTRAVENOUS

## 2015-05-26 MED ORDER — CEFAZOLIN SODIUM-DEXTROSE 2-3 GM-% IV SOLR
INTRAVENOUS | Status: DC | PRN
  Administered 2015-05-26: 2 g via INTRAVENOUS

## 2015-05-26 SURGICAL SUPPLY — 80 items
ADAPTER IRRIG TUBE 2 SPIKE SOL (ADAPTER) ×3 IMPLANT
ANCHOR ALL-SUT Q-FIX 2.8 (Anchor) ×6 IMPLANT
ANCHOR SUT 5.5 SPEEDSCREW (Screw) ×6 IMPLANT
ANCHOR SUT SCREW SPEED 6.5 (Screw) ×2 IMPLANT
ANCHOR SUT SCREW SPEED 6.5MM (Screw) ×1 IMPLANT
ARTHROWAND PARAGON T2 (SURGICAL WAND)
BLADE SHAVER 4.5X7 STR FR (MISCELLANEOUS) ×3 IMPLANT
BLADE SURG 15 STRL LF DISP TIS (BLADE) IMPLANT
BLADE SURG 15 STRL SS (BLADE)
BUR ABRADER 5.5 BLK (MISCELLANEOUS) ×1 IMPLANT
BUR BR 5.5 12 FLUTE (BURR) IMPLANT
BUR HOODED 3.0 ABRASION (BURR) IMPLANT
BUR RADIUS 4.0X18.5 (BURR) IMPLANT
BUR RADIUS 5.5 (BURR) IMPLANT
BURR ABRADER 5.5 BLK (MISCELLANEOUS) ×3
CANNULA 8.5X75 THRED (CANNULA) IMPLANT
CANNULA SHAVER 8MMX76MM (CANNULA) IMPLANT
CAP LOCK ULTRA CANNULA (MISCELLANEOUS) IMPLANT
CUTTER CANN W/HOLE 4.5 (CUTTER) IMPLANT
CUTTER SLOTTED WHISKER 4.0 (BURR) IMPLANT
DRAPE STERI 35X30 U-POUCH (DRAPES) ×3 IMPLANT
DURAPREP 26ML APPLICATOR (WOUND CARE) ×3 IMPLANT
GAUZE SPONGE 4X4 12PLY STRL (GAUZE/BANDAGES/DRESSINGS) ×3 IMPLANT
GLOVE BIO SURGEON STRL SZ7.5 (GLOVE) ×3 IMPLANT
GLOVE BIO SURGEON STRL SZ8 (GLOVE) ×3 IMPLANT
GLOVE INDICATOR 8.0 STRL GRN (GLOVE) ×3 IMPLANT
GOWN STRL REIN 2XL LVL4 (GOWN DISPOSABLE) ×3 IMPLANT
GOWN STRL REUS W/ TWL LRG LVL3 (GOWN DISPOSABLE) ×1 IMPLANT
GOWN STRL REUS W/TWL LRG LVL3 (GOWN DISPOSABLE) ×2
IV LACTATED RINGER IRRG 3000ML (IV SOLUTION) ×4
IV LR IRRIG 3000ML ARTHROMATIC (IV SOLUTION) ×2 IMPLANT
KIT SHOULDER TRACTION (DRAPES) ×3 IMPLANT
KIT SUTURE 2.8 Q-FIX DISP (MISCELLANEOUS) ×3 IMPLANT
MANIFOLD 4PT FOR NEPTUNE1 (MISCELLANEOUS) ×3 IMPLANT
NEEDLE 18GX1X1/2 (RX/OR ONLY) (NEEDLE) IMPLANT
NEEDLE MAYO CATGUT SZ 1.5 (NEEDLE)
NEEDLE MAYO CATGUT SZ 2 (NEEDLE) IMPLANT
NEEDLE SPNL 18GX3.5 QUINCKE PK (NEEDLE) IMPLANT
PACK ARTHROSCOPY SHOULDER (MISCELLANEOUS) ×3 IMPLANT
PAD GROUND ADULT SPLIT (MISCELLANEOUS) ×3 IMPLANT
PASSER SUT CAPTURE FIRST (SUTURE) ×3 IMPLANT
SET TUBE SUCT SHAVER OUTFL 24K (TUBING) ×3 IMPLANT
SLING ULTRA II M (MISCELLANEOUS) ×3 IMPLANT
SOL PREP PVP 2OZ (MISCELLANEOUS) ×3
SOLUTION PREP PVP 2OZ (MISCELLANEOUS) ×1 IMPLANT
STAPLER SKIN PROX 35W (STAPLE) ×3 IMPLANT
SUT ETHIBOND NAB CT1 #1 30IN (SUTURE) ×3 IMPLANT
SUT ETHILON 3-0 FS-10 30 BLK (SUTURE)
SUT MAGNUM WIRE 2 (SUTURE) IMPLANT
SUT PDS AB 1 CT1 27 (SUTURE) IMPLANT
SUT PDSII 0 (SUTURE) IMPLANT
SUT PERFECTPASSER WHITE CART (SUTURE) IMPLANT
SUT PROLENE 2 0 CT2 30 (SUTURE) IMPLANT
SUT SMART STITCH CARTRIDGE (SUTURE) IMPLANT
SUT TICRON 2-0 30IN 311381 (SUTURE) IMPLANT
SUT VIC AB 0 CT1 36 (SUTURE) ×3 IMPLANT
SUT VIC AB 0 CT2 27 (SUTURE) IMPLANT
SUT VIC AB 2-0 CT1 27 (SUTURE) ×2
SUT VIC AB 2-0 CT1 TAPERPNT 27 (SUTURE) ×1 IMPLANT
SUT VIC AB 2-0 CT2 27 (SUTURE) IMPLANT
SUT VIC AB 2-0 SH 27 (SUTURE)
SUT VIC AB 2-0 SH 27XBRD (SUTURE) IMPLANT
SUT VIC AB 3-0 SH 27 (SUTURE)
SUT VIC AB 3-0 SH 27X BRD (SUTURE) IMPLANT
SUTURE EHLN 3-0 FS-10 30 BLK (SUTURE) IMPLANT
SUTURE MAGNUM WIRE 2X48 BLK (SUTURE) IMPLANT
SYRINGE 10CC LL (SYRINGE) IMPLANT
TAPE MICROFOAM 4IN (TAPE) ×3 IMPLANT
TUBING ARTHRO INFLOW-ONLY STRL (TUBING) ×3 IMPLANT
WAND 30 DEG SABER W/CORD (SURGICAL WAND) IMPLANT
WAND ARTHRO PARAGON T2 (SURGICAL WAND) IMPLANT
WAND COVAC 50 IFS (MISCELLANEOUS) IMPLANT
WAND COVATOR 20 (MISCELLANEOUS) IMPLANT
WAND HAND CNTRL MULTIVAC 50 (MISCELLANEOUS) IMPLANT
WAND HAND CNTRL MULTIVAC 90 (MISCELLANEOUS) IMPLANT
WAND MEGAVAC 90 (MISCELLANEOUS) ×3 IMPLANT
WAND TENDON TOPAZ 0 ANGL (MISCELLANEOUS) IMPLANT
WAND TOPAZ EPF  WAS Q (MISCELLANEOUS)
WAND TOPAZ EPF WAS Q (MISCELLANEOUS) IMPLANT
WIRE MAGNUM (SUTURE) IMPLANT

## 2015-05-26 NOTE — Op Note (Signed)
05/26/2015  2:53 PM  Patient:   Faith Garrett  Pre-Op Diagnosis:CHRONIC  PAIN RIGHT SHOULDER M25.511 M75.41.  Probable rotator cuff tear with secondary impingement  Postoperative diagnosis: Rotator cuff tear right shoulder plus rupture of the long head of biceps tendon and subacromial impingement Procedure: Arthroscopic subacromial decompression plus mini incision rotator cuff repair on the right shoulder.  Anesthesia: General endotracheal with interscalene block placed preoperatively by the anesthesiologist.  Findings: As above.   Complications: None  Estimated blood loss: negligable  Tourniquet time: None  Drains: None   Brief clinical note:  The patient's symptoms have progressed despite medications, activity modification, etc. The patient's history and examination are consistent with impingement symptomatology with probable rotator cuff tear. These findings were confirmed by MRI scan. The patient presents at this time for definitive management of these shoulder symptoms.  Procedure: The patient was brought into the operating room and placed in the supine position after having an interscalene block on the right shoulder in the preop area. The patient then underwent general endotracheal intubation and anesthesia before being repositioned in the lateral decubitus position. The shoulder was supported with the Acufex shoulder suspension device.  12 pounds of traction was utilized. The right shoulder and upper extremity were prepped and draped in usual fashion. Preoperative antibiotics were administered. A timeout was performed . A posterior portal was created and the glenohumeral joint thoroughly inspected revealing an old rupture of the long head of biceps tendon plus a moderate size cuff tear in the supraspinatus there was a reactive synovitis within the glenohumeral joint. The glenohumeral joint surfaces were free of any major chondral pathology other than mild  articular thinning.. An anterior portal was created. An ArthroCare wand was inserted and used to obtain hemostasis as well as to perform a limited synovectomy.The biceps tendon* was debrided.  The scope was repositioned through the posterior portal into the subacromial space. A separate lateral portal was created using an outside-in technique. An ArthroCare 90 wand followed by a 4.0 full-radius resector was introduced and used to perform a subtotal bursectomy. The ArthroCare wand was then inserted and used to remove the periosteal tissue off the undersurface of the anterior third of the acromion as well as to recess the coracoacromial ligament from its attachment along the anterior and lateral margins of the acromion.   With the scope in the lateral portal a 5.23mm acromionizing bur was introduced through the posterior portal and used to perform the decompression by removing the undersurface of the anterior third of the acromion. I also used a 5.5 mm round bur to lightly decorticate the greater tuberosity in the intended area of the rotator rotator cuff reattachment. I also used a small punch to make vascular vent holes in the greater tuberosity. The instruments were then removed from the subacromial space.  An approximately 4 cm incision was made over the midlateral aspect of the shoulder.   This incision was carried down through the subcutaneous tissues onto the deltoid. The deltoid was divided in line with its fibers to provide access into the subacromial space. The rotator cuff tear was readily identified. The margins were debrided.   The tear was repaired using 2 ArthroCare Q fix anchors medially and 2 Speed screws laterally. The Q fix sutures were tied down in horizontal mattress fashion and then they were crisscrossed over to the 2 speed screws which were anchored in the greater tuberosity.   The wound was copiously irrigated with sterile saline solution before the anterior  deltoid was repaired to bone  with #1 ethibond sutures  Deltoid interval closed with 0 vicryl. The subcutaneous tissues were closed  using 2-0 Vicryl interrupted sutures before the skin was closed using staples. The portal sites also were closed using 4-O nylon sutures. A sterile bulky dressing was applied to the shoulder. The right upper extremity was placed into a shoulder immobilizer. The patient was then awakened, extubated, and returned to the recovery room in satisfactory condition after tolerating the procedure well.  Blood loss was negligible.

## 2015-05-26 NOTE — Anesthesia Postprocedure Evaluation (Signed)
  Anesthesia Post-op Note  Patient: Faith Garrett  Procedure(s) Performed: Procedure(s) with comments: SHOULDER ARTHROSCOPY WITH OPEN ROTATOR CUFF REPAIR (Right) - subacromial decompression  Anesthesia type:General  Patient location: PACU  Post pain: Pain level controlled  Post assessment: Post-op Vital signs reviewed, Patient's Cardiovascular Status Stable, Respiratory Function Stable, Patent Airway and No signs of Nausea or vomiting  Post vital signs: Reviewed and stable  Last Vitals:  Filed Vitals:   05/26/15 1200  BP: 122/66  Pulse: 77  Temp:   Resp: 22    Level of consciousness: awake, alert  and patient cooperative  Complications: No apparent anesthesia complications

## 2015-05-26 NOTE — H&P (Signed)
  H and P reviewed. No changes. Uploaded at later date. 

## 2015-05-26 NOTE — Transfer of Care (Signed)
Immediate Anesthesia Transfer of Care Note  Patient: Faith Garrett  Procedure(s) Performed: Procedure(s) with comments: SHOULDER ARTHROSCOPY WITH OPEN ROTATOR CUFF REPAIR (Right) - subacromial decompression  Patient Location: PACU  Anesthesia Type: General  Level of Consciousness: awake, alert  and patient cooperative  Airway and Oxygen Therapy: Patient Spontanous Breathing and Patient connected to supplemental oxygen  Post-op Assessment: Post-op Vital signs reviewed, Patient's Cardiovascular Status Stable, Respiratory Function Stable, Patent Airway and No signs of Nausea or vomiting  Post-op Vital Signs: Reviewed and stable  Complications: No apparent anesthesia complications

## 2015-05-26 NOTE — Anesthesia Preprocedure Evaluation (Addendum)
Anesthesia Evaluation  Patient identified by MRN, date of birth, ID band Patient awake    Reviewed: Allergy & Precautions, H&P , NPO status , Patient's Chart, lab work & pertinent test results, reviewed documented beta blocker date and time   Airway Mallampati: II  TM Distance: >3 FB Neck ROM: full    Dental no notable dental hx.    Pulmonary neg pulmonary ROS, former smoker,  breath sounds clear to auscultation  Pulmonary exam normal       Cardiovascular Exercise Tolerance: Good negative cardio ROS  Rhythm:regular Rate:Normal     Neuro/Psych negative neurological ROS  negative psych ROS   GI/Hepatic Neg liver ROS, PUD,   Endo/Other  Hypothyroidism   Renal/GU negative Renal ROS  negative genitourinary   Musculoskeletal   Abdominal   Peds  Hematology negative hematology ROS (+)   Anesthesia Other Findings   Reproductive/Obstetrics negative OB ROS                            Anesthesia Physical Anesthesia Plan  ASA: II  Anesthesia Plan: General   Post-op Pain Management: GA combined w/ Regional for post-op pain   Induction: Intravenous  Airway Management Planned: LMA  Additional Equipment:   Intra-op Plan:   Post-operative Plan:   Informed Consent: I have reviewed the patients History and Physical, chart, labs and discussed the procedure including the risks, benefits and alternatives for the proposed anesthesia with the patient or authorized representative who has indicated his/her understanding and acceptance.   Dental Advisory Given  Plan Discussed with: CRNA  Anesthesia Plan Comments:        Anesthesia Quick Evaluation

## 2015-05-26 NOTE — Discharge Instructions (Signed)
General Anesthesia, Care After Refer to this sheet in the next few weeks. These instructions provide you with information on caring for yourself after your procedure. Your health care provider may also give you more specific instructions. Your treatment has been planned according to current medical practices, but problems sometimes occur. Call your health care provider if you have any problems or questions after your procedure. WHAT TO EXPECT AFTER THE PROCEDURE After the procedure, it is typical to experience:  Sleepiness.  Nausea and vomiting. HOME CARE INSTRUCTIONS  For the first 24 hours after general anesthesia:  Have a responsible person with you.  Do not drive a car. If you are alone, do not take public transportation.  Do not drink alcohol.  Do not take medicine that has not been prescribed by your health care provider.  Do not sign important papers or make important decisions.  You may resume a normal diet and activities as directed by your health care provider.  Change bandages (dressings) as directed.  If you have questions or problems that seem related to general anesthesia, call the hospital and ask for the anesthetist or anesthesiologist on call. SEEK MEDICAL CARE IF:  You have nausea and vomiting that continue the day after anesthesia.  You develop a rash. SEEK IMMEDIATE MEDICAL CARE IF:   You have difficulty breathing.  You have chest pain.  You have any allergic problems. Document Released: 01/20/2001 Document Revised: 10/19/2013 Document Reviewed: 04/29/2013 [facility] Patient Information 2015 [place], [place]. This information is not intended to replace advice given to you by your health care provider. Make sure you discuss any questions you have with your health care provider. General Anesthesia, Care After Refer to this sheet in the next few weeks. These instructions provide you with information on caring for yourself after your procedure. Your health care  provider may also give you more specific instructions. Your treatment has been planned according to current medical practices, but problems sometimes occur. Call your health care provider if you have any problems or questions after your procedure. WHAT TO EXPECT AFTER THE PROCEDURE After the procedure, it is typical to experience:  Sleepiness.  Nausea and vomiting. HOME CARE INSTRUCTIONS  For the first 24 hours after general anesthesia:  Have a responsible person with you.  Do not drive a car. If you are alone, do not take public transportation.  Do not drink alcohol.  Do not take medicine that has not been prescribed by your health care provider.  Do not sign important papers or make important decisions.  You may resume a normal diet and activities as directed by your health care provider.  Change bandages (dressings) as directed.  If you have questions or problems that seem related to general anesthesia, call the hospital and ask for the anesthetist or anesthesiologist on call. SEEK MEDICAL CARE IF:  You have nausea and vomiting that continue the day after anesthesia.  You develop a rash. SEEK IMMEDIATE MEDICAL CARE IF:   You have difficulty breathing.  You have chest pain.  You have any allergic problems.  Document Released: 01/20/2001 Document Revised: 10/19/2013 Document Reviewed: 04/29/2013 [facility] Patient Information 2015 [place], [place]. This information is not intended to replace advice given to you by your health care provider. Make sure you discuss any questions you have with your health care provider. Diet: As you were doing prior to hospitalization   Shower:  Sponge bathe for present  Dressing:  Leave in place  Activity:  Increase activity slowly as tolerated.  Can drive when comfortable.    Sling: Leave immobilizer on    RTC: 1 week  Ice pack as needed   To prevent constipation: you may use a stool softener such as - Miralax (over the counter) for  constipation as needed.    To prevent venous clotting Take one 81 mg. ASA tablet  2X per day for about 2 weeks post surgery.  Precautions:  If you experience chest pain or shortness of breath - call 911 immediately for transfer to the hospital emergency department!!  If you develop a fever greater that 101 F, purulent drainage from wound, increased redness or drainage from wound, or calf pain -- Call the office at [phone]                                             Follow- Up Appointment:  Please call for an appointment to be seen in 1 wk.

## 2015-05-26 NOTE — Progress Notes (Signed)
[facility] ANMD with right, interscalene  block. Side rails up, monitors on throughout procedure. See vital signs in flow sheet. Tolerated Procedure well.

## 2015-05-26 NOTE — Anesthesia Procedure Notes (Addendum)
Anesthesia Regional Block:  Interscalene brachial plexus block  Pre-Anesthetic Checklist: ,, timeout performed, Correct Patient, Correct Site, Correct Laterality, Correct Procedure,, site marked, risks and benefits discussed, Surgical consent, Pre-op evaluation,  At surgeon's request  Laterality: Right  Prep: chloraprep       Needles:   Needle Type: Stimiplex     Needle Length: 10cm 10 cm Needle Gauge: 21 and 21 G    Additional Needles:  Procedures: ultrasound guided (picture in chart) and nerve stimulator  Motor weakness within 10 minutes. Interscalene brachial plexus block  Nerve Stimulator or Paresthesia:  Response: deltoid, bicep, 0.5 mA,   Additional Responses:   Narrative:  Start time: 05/26/2015 9:03 AM End time: 05/26/2015 9:08 AM Injection made incrementally with aspirations every 5 mL.  Performed by: Personally  Anesthesiologist: Jearld Adjutant B   Procedure Name: LMA Insertion Date/Time: 05/26/2015 9:35 AM Performed by: Londell Moh Pre-anesthesia Checklist: Patient identified, Emergency Drugs available, Suction available, Timeout performed and Patient being monitored Patient Re-evaluated:Patient Re-evaluated prior to inductionOxygen Delivery Method: Circle system utilized Preoxygenation: Pre-oxygenation with 100% oxygen Intubation Type: IV induction LMA: LMA inserted LMA Size: 4.0 Number of attempts: 1 Placement Confirmation: positive ETCO2 and breath sounds checked- equal and bilateral Tube secured with: Tape

## 2015-05-29 ENCOUNTER — Encounter: Payer: Self-pay | Admitting: Unknown Physician Specialty

## 2015-06-20 NOTE — Addendum Note (Signed)
Addendum  created 06/20/15 0956 by Ronelle Nigh, MD   Modules edited: Anesthesia Medication Administration

## 2015-11-01 DIAGNOSIS — Z9889 Other specified postprocedural states: Secondary | ICD-10-CM | POA: Diagnosis not present

## 2015-11-13 DIAGNOSIS — E7800 Pure hypercholesterolemia, unspecified: Secondary | ICD-10-CM | POA: Diagnosis not present

## 2015-11-13 DIAGNOSIS — E039 Hypothyroidism, unspecified: Secondary | ICD-10-CM | POA: Diagnosis not present

## 2015-11-13 DIAGNOSIS — R740 Nonspecific elevation of levels of transaminase and lactic acid dehydrogenase [LDH]: Secondary | ICD-10-CM | POA: Diagnosis not present

## 2015-11-13 DIAGNOSIS — E6609 Other obesity due to excess calories: Secondary | ICD-10-CM | POA: Diagnosis not present

## 2015-11-21 DIAGNOSIS — L400 Psoriasis vulgaris: Secondary | ICD-10-CM | POA: Diagnosis not present

## 2015-11-28 DIAGNOSIS — R945 Abnormal results of liver function studies: Secondary | ICD-10-CM | POA: Diagnosis not present

## 2015-11-29 ENCOUNTER — Other Ambulatory Visit: Payer: Self-pay | Admitting: Family Medicine

## 2015-11-29 DIAGNOSIS — R945 Abnormal results of liver function studies: Secondary | ICD-10-CM

## 2015-12-04 ENCOUNTER — Ambulatory Visit
Admission: RE | Admit: 2015-12-04 | Discharge: 2015-12-04 | Disposition: A | Discharge status: Home / Self Care | Payer: PPO | Source: Ambulatory Visit | Attending: Family Medicine | Admitting: Family Medicine

## 2015-12-04 DIAGNOSIS — R748 Abnormal levels of other serum enzymes: Secondary | ICD-10-CM | POA: Insufficient documentation

## 2015-12-04 DIAGNOSIS — R945 Abnormal results of liver function studies: Secondary | ICD-10-CM | POA: Diagnosis not present

## 2015-12-06 ENCOUNTER — Emergency Department: Payer: PPO

## 2015-12-06 ENCOUNTER — Emergency Department
Admission: EM | Admit: 2015-12-06 | Discharge: 2015-12-06 | Disposition: A | Discharge status: Home / Self Care | Payer: PPO | Attending: Emergency Medicine | Admitting: Emergency Medicine

## 2015-12-06 ENCOUNTER — Encounter: Payer: Self-pay | Admitting: *Deleted

## 2015-12-06 DIAGNOSIS — J069 Acute upper respiratory infection, unspecified: Secondary | ICD-10-CM | POA: Diagnosis not present

## 2015-12-06 DIAGNOSIS — R109 Unspecified abdominal pain: Secondary | ICD-10-CM | POA: Diagnosis not present

## 2015-12-06 DIAGNOSIS — R1013 Epigastric pain: Secondary | ICD-10-CM | POA: Diagnosis not present

## 2015-12-06 DIAGNOSIS — Z87891 Personal history of nicotine dependence: Secondary | ICD-10-CM | POA: Diagnosis not present

## 2015-12-06 DIAGNOSIS — R1110 Vomiting, unspecified: Secondary | ICD-10-CM | POA: Insufficient documentation

## 2015-12-06 DIAGNOSIS — R61 Generalized hyperhidrosis: Secondary | ICD-10-CM | POA: Insufficient documentation

## 2015-12-06 DIAGNOSIS — R1010 Upper abdominal pain, unspecified: Secondary | ICD-10-CM | POA: Diagnosis not present

## 2015-12-06 DIAGNOSIS — R1084 Generalized abdominal pain: Secondary | ICD-10-CM | POA: Diagnosis not present

## 2015-12-06 DIAGNOSIS — K760 Fatty (change of) liver, not elsewhere classified: Secondary | ICD-10-CM | POA: Diagnosis not present

## 2015-12-06 LAB — CBC WITH DIFFERENTIAL/PLATELET
Basophils Absolute: 0 10*3/uL (ref 0–0.1)
Basophils Relative: 0 %
Eosinophils Absolute: 0.1 10*3/uL (ref 0–0.7)
Eosinophils Relative: 2 %
HEMATOCRIT: 41.2 % (ref 35.0–47.0)
HEMOGLOBIN: 14 g/dL (ref 12.0–16.0)
LYMPHS ABS: 0.8 10*3/uL — AB (ref 1.0–3.6)
Lymphocytes Relative: 13 %
MCH: 29.4 pg (ref 26.0–34.0)
MCHC: 34.1 g/dL (ref 32.0–36.0)
MCV: 86.2 fL (ref 80.0–100.0)
Monocytes Absolute: 0.2 10*3/uL (ref 0.2–0.9)
Monocytes Relative: 3 %
NEUTROS ABS: 5.3 10*3/uL (ref 1.4–6.5)
NEUTROS PCT: 82 %
Platelets: 252 10*3/uL (ref 150–440)
RBC: 4.78 MIL/uL (ref 3.80–5.20)
RDW: 13.2 % (ref 11.5–14.5)
WBC: 6.5 10*3/uL (ref 3.6–11.0)

## 2015-12-06 LAB — COMPREHENSIVE METABOLIC PANEL
ALK PHOS: 129 U/L — AB (ref 38–126)
ALT: 164 U/L — ABNORMAL HIGH (ref 14–54)
ANION GAP: 9 (ref 5–15)
AST: 111 U/L — ABNORMAL HIGH (ref 15–41)
Albumin: 4 g/dL (ref 3.5–5.0)
BILIRUBIN TOTAL: 0.6 mg/dL (ref 0.3–1.2)
BUN: 14 mg/dL (ref 6–20)
CALCIUM: 9.9 mg/dL (ref 8.9–10.3)
CO2: 23 mmol/L (ref 22–32)
CREATININE: 0.57 mg/dL (ref 0.44–1.00)
Chloride: 106 mmol/L (ref 101–111)
GFR calc non Af Amer: >60 mL/min (ref >60)
Glucose, Bld: 107 mg/dL — ABNORMAL HIGH (ref 65–99)
Potassium: 4.1 mmol/L (ref 3.5–5.1)
Sodium: 138 mmol/L (ref 135–145)
TOTAL PROTEIN: 7.1 g/dL (ref 6.5–8.1)

## 2015-12-06 LAB — LIPASE, BLOOD: Lipase: 19 U/L (ref 11–51)

## 2015-12-06 LAB — TROPONIN I: Troponin I: <0.03 ng/mL (ref <0.031)

## 2015-12-06 MED ORDER — OMEPRAZOLE 40 MG PO CPDR: 40.0000 mg | DELAYED_RELEASE_CAPSULE | Freq: Every day | ORAL | Status: DC

## 2015-12-06 MED ORDER — ONDANSETRON HCL 4 MG/2ML IJ SOLN
4.0000 mg | Freq: Once | INTRAMUSCULAR | Status: AC
  Administered 2015-12-06: 4 mg via INTRAVENOUS
  Filled 2015-12-06: qty 2

## 2015-12-06 MED ORDER — HYDROMORPHONE HCL 1 MG/ML IJ SOLN
1.0000 mg | Freq: Once | INTRAMUSCULAR | Status: AC
  Administered 2015-12-06: 1 mg via INTRAVENOUS

## 2015-12-06 MED ORDER — IOHEXOL 300 MG/ML  SOLN
100.0000 mL | Freq: Once | INTRAMUSCULAR | Status: AC | PRN
  Administered 2015-12-06: 100 mL via INTRAVENOUS

## 2015-12-06 MED ORDER — HYDROMORPHONE HCL 1 MG/ML IJ SOLN
1.0000 mg | Freq: Once | INTRAMUSCULAR | Status: AC
  Administered 2015-12-06: 1 mg via INTRAVENOUS
  Filled 2015-12-06: qty 1

## 2015-12-06 MED ORDER — SUCRALFATE 1 G PO TABS: 1.0000 g | ORAL_TABLET | Freq: Four times a day (QID) | ORAL | Status: AC

## 2015-12-06 MED ORDER — OXYCODONE-ACETAMINOPHEN 5-325 MG PO TABS: 2.0000 | ORAL_TABLET | Freq: Four times a day (QID) | ORAL | Status: DC | PRN

## 2015-12-06 MED ORDER — FAMOTIDINE 20 MG PO TABS: 20.0000 mg | ORAL_TABLET | Freq: Every day | ORAL | Status: DC

## 2015-12-06 MED ORDER — SODIUM CHLORIDE 0.9 % IV SOLN
1000.0000 mL | Freq: Once | INTRAVENOUS | Status: AC
  Administered 2015-12-06: 1000 mL via INTRAVENOUS

## 2015-12-06 MED ORDER — HYDROMORPHONE HCL 1 MG/ML IJ SOLN
INTRAMUSCULAR | Status: AC
  Filled 2015-12-06: qty 1

## 2015-12-06 MED ORDER — PANTOPRAZOLE SODIUM 40 MG PO TBEC
40.0000 mg | DELAYED_RELEASE_TABLET | Freq: Once | ORAL | Status: AC
  Administered 2015-12-06: 40 mg via ORAL
  Filled 2015-12-06: qty 1

## 2015-12-06 MED ORDER — HYDROMORPHONE HCL 1 MG/ML IJ SOLN
0.5000 mg | Freq: Once | INTRAMUSCULAR | Status: AC
  Administered 2015-12-06: 0.5 mg via INTRAVENOUS
  Filled 2015-12-06: qty 1

## 2015-12-06 MED ORDER — FAMOTIDINE 20 MG PO TABS
20.0000 mg | ORAL_TABLET | Freq: Once | ORAL | Status: AC
  Administered 2015-12-06: 20 mg via ORAL
  Filled 2015-12-06: qty 1

## 2015-12-06 NOTE — ED Notes (Signed)
Pt arrives via EMS from home with complaints of upper abd pain shooting to her back, pt arrives rocking in pain and moaning, pale and diaphoretic, pt states some nausea, pt states pain comes and goes starting this AM

## 2015-12-06 NOTE — Discharge Instructions (Signed)
Food Choices for Peptic Ulcer Disease When you have peptic ulcer disease, the foods you eat and your eating habits are very important. Choosing the right foods can help ease the discomfort of peptic ulcer disease. WHAT GENERAL GUIDELINES DO I NEED TO FOLLOW?  Choose fruits, vegetables, whole grains, and low-fat meat, fish, and poultry.   Keep a food diary to identify foods that cause symptoms.  Avoid foods that cause irritation or pain. These may be different for different people.  Eat frequent small meals instead of three large meals each day. The pain may be worse when your stomach is empty.  Avoid eating close to bedtime. WHAT FOODS ARE NOT RECOMMENDED? The following are some foods and drinks that may worsen your symptoms:  Black, white, and red pepper.  Hot sauce.  Chili peppers.  Chili powder.  Chocolate and cocoa.   Alcohol.  Tea, coffee, and cola (regular and decaffeinated). The items listed above may not be a complete list of foods and beverages to avoid. Contact your dietitian for more information.   This information is not intended to replace advice given to you by your health care provider. Make sure you discuss any questions you have with your health care provider.   Document Released: 01/06/2012 Document Revised: 10/19/2013 Document Reviewed: 08/18/2013 Elsevier Interactive Patient Education 2016 [place]. Gastroesophageal Reflux Disease, Adult Normally, food travels down the esophagus and stays in the stomach to be digested. However, when a person has gastroesophageal reflux disease (GERD), food and stomach acid move back up into the esophagus. When this happens, the esophagus becomes sore and inflamed. Over time, GERD can create small holes (ulcers) in the lining of the esophagus.  CAUSES This condition is caused by a problem with the muscle between the esophagus and the stomach (lower esophageal sphincter, or LES). Normally, the LES muscle closes after  food passes through the esophagus to the stomach. When the LES is weakened or abnormal, it does not close properly, and that allows food and stomach acid to go back up into the esophagus. The LES can be weakened by certain dietary substances, medicines, and medical conditions, including:  Tobacco use.  Pregnancy.  Having a hiatal hernia.  Heavy alcohol use.  Certain foods and beverages, such as coffee, chocolate, onions, and peppermint. RISK FACTORS This condition is more likely to develop in:  People who have an increased body weight.  People who have connective tissue disorders.  People who use NSAID medicines. SYMPTOMS Symptoms of this condition include:  Heartburn.  Difficult or painful swallowing.  The feeling of having a lump in the throat.  Abitter taste in the mouth.  Bad breath.  Having a large amount of saliva.  Having an upset or bloated stomach.  Belching.  Chest pain.  Shortness of breath or wheezing.  Ongoing (chronic) cough or a night-time cough.  Wearing away of tooth enamel.  Weight loss. Different conditions can cause chest pain. Make sure to see your health care provider if you experience chest pain. DIAGNOSIS Your health care provider will take a medical history and perform a physical exam. To determine if you have mild or severe GERD, your health care provider may also monitor how you respond to treatment. You may also have other tests, including:  An endoscopy toexamine your stomach and esophagus with a small camera.  A test thatmeasures the acidity level in your esophagus.  A test thatmeasures how much pressure is on your esophagus.  A barium swallow or modified barium  swallow to show the shape, size, and functioning of your esophagus. TREATMENT The goal of treatment is to help relieve your symptoms and to prevent complications. Treatment for this condition may vary depending on how severe your symptoms are. Your health care  provider may recommend:  Changes to your diet.  Medicine.  Surgery. HOME CARE INSTRUCTIONS Diet  Follow a diet as recommended by your health care provider. This may involve avoiding foods and drinks such as:  Coffee and tea (with or without caffeine).  Drinks that containalcohol.  Energy drinks and sports drinks.  Carbonated drinks or sodas.  Chocolate and cocoa.  Peppermint and mint flavorings.  Garlic and onions.  Horseradish.  Spicy and acidic foods, including peppers, chili powder, curry powder, vinegar, hot sauces, and barbecue sauce.  Citrus fruit juices and citrus fruits, such as oranges, lemons, and limes.  Tomato-based foods, such as red sauce, chili, salsa, and pizza with red sauce.  Fried and fatty foods, such as donuts, french fries, potato chips, and high-fat dressings.  High-fat meats, such as hot dogs and fatty cuts of red and white meats, such as rib eye steak, sausage, ham, and bacon.  High-fat dairy items, such as whole milk, butter, and cream cheese.  Eat small, frequent meals instead of large meals.  Avoid drinking large amounts of liquid with your meals.  Avoid eating meals during the 2-3 hours before bedtime.  Avoid lying down right after you eat.  Do not exercise right after you eat. General Instructions  Pay attention to any changes in your symptoms.  Take over-the-counter and prescription medicines only as told by your health care provider. Do not take aspirin, ibuprofen, or other NSAIDs unless your health care provider told you to do so.  Do not use any tobacco products, including cigarettes, chewing tobacco, and e-cigarettes. If you need help quitting, ask your health care provider.  Wear loose-fitting clothing. Do not wear anything tight around your waist that causes pressure on your abdomen.  Raise (elevate) the head of your bed 6 inches (15cm).  Try to reduce your stress, such as with yoga or meditation. If you need help  reducing stress, ask your health care provider.  If you are overweight, reduce your weight to an amount that is healthy for you. Ask your health care provider for guidance about a safe weight loss goal.  Keep all follow-up visits as told by your health care provider. This is important. SEEK MEDICAL CARE IF:  You have new symptoms.  You have unexplained weight loss.  You have difficulty swallowing, or it hurts to swallow.  You have wheezing or a persistent cough.  Your symptoms do not improve with treatment.  You have a hoarse voice. SEEK IMMEDIATE MEDICAL CARE IF:  You have pain in your arms, neck, jaw, teeth, or back.  You feel sweaty, dizzy, or light-headed.  You have chest pain or shortness of breath.  You vomit and your vomit looks like blood or coffee grounds.  You faint.  Your stool is bloody or black.  You cannot swallow, drink, or eat.   This information is not intended to replace advice given to you by your health care provider. Make sure you discuss any questions you have with your health care provider.   Document Released: 07/24/2005 Document Revised: 07/05/2015 Document Reviewed: 02/08/2015 Elsevier Interactive Patient Education [employer].

## 2015-12-06 NOTE — ED Provider Notes (Signed)
[facility] Emergency Department Provider Note     Time seen: ----------------------------------------- 11:17 AM on 12/06/2015 -----------------------------------------    I have reviewed the triage vital signs and the nursing notes.   HISTORY  Chief Complaint Abdominal Pain    HPI Faith Garrett is a 70 y.o. female who presents ER with severe upper abdominal pain that radiates into her back. Patient has never had a history of this before, nothing makes her symptoms better or worse. EMS had arrived on the scene and she initially did not want transport the pain subsequently restarted here in the ER. 10/10 pain in the epigastrium.   Past Medical History  Diagnosis Date  . Breast cancer ([place]) 2001    LT LUMPECTOMY  . Radiation 2001    LT BREAST  . S/P chemotherapy, time since greater than 12 weeks 2001    BREAST CA  . Hyperlipidemia   . Hypothyroidism   . Gastric ulcer     H/O    There are no active problems to display for this patient.   Past Surgical History  Procedure Laterality Date  . Breast lumpectomy Left   . Cholecystectomy    . Hemorrhoid surgery    . Colonoscopy    . Esophagogastroduodenoscopy    . Wrist fracture surgery Left   . Carpal tunnel release Bilateral   . Shoulder arthroscopy with open rotator cuff repair Right 05/26/2015    Procedure: SHOULDER ARTHROSCOPY WITH OPEN ROTATOR CUFF REPAIR;  Surgeon: Leanor Kail, MD;  Location: [place];  Service: Orthopedics;  Laterality: Right;  subacromial decompression    Allergies Other and Codeine sulfate  Social History Social History  Substance Use Topics  . Smoking status: Former Smoker    Quit date: 10/28/1966  . Smokeless tobacco: None  . Alcohol Use: No    Review of Systems Constitutional: Negative for fever. Eyes: Negative for visual changes. ENT: Negative for sore throat. Cardiovascular: Negative for chest pain. Respiratory: Negative for shortness of  breath. Gastrointestinal: Positive for abdominal pain and vomiting Genitourinary: Negative for dysuria. Musculoskeletal: Negative for back pain. Skin: Positive for diaphoresis Neurological: Negative for headaches, focal weakness or numbness.  10-point ROS otherwise negative.  ____________________________________________   PHYSICAL EXAM:  VITAL SIGNS: ED Triage Vitals  Enc Vitals Group     BP 12/06/15 1111 155/87 mmHg     Pulse Rate 12/06/15 1111 75     Resp 12/06/15 1111 18     Temp 12/06/15 1111 97.7 F (36.5 C)     Temp Source 12/06/15 1111 Oral     SpO2 12/06/15 1111 100 %     Weight 12/06/15 1111 210 lb (95.255 kg)     Height 12/06/15 1111 5\' 6"  (1.676 m)     Head Cir --      Peak Flow --      Pain Score 12/06/15 1112 4     Pain Loc --      Pain Edu? --      Excl. in [place]? --     Constitutional: Alert and oriented. Moderate distress Eyes: Conjunctivae are normal. PERRL. Normal extraocular movements. ENT   Head: Normocephalic and atraumatic.   Nose: No congestion/rhinnorhea.   Mouth/Throat: Mucous membranes are moist.   Neck: No stridor. Cardiovascular: Normal rate, regular rhythm. Normal and symmetric distal pulses are present in all extremities. No murmurs, rubs, or gallops. Respiratory: Normal respiratory effort without tachypnea nor retractions. Breath sounds are clear and equal bilaterally. No wheezes/rales/rhonchi. Gastrointestinal:  Epigastric tenderness, no rebound or guarding. Normal bowel sounds. Musculoskeletal: Nontender with normal range of motion in all extremities. No joint effusions.  No lower extremity tenderness nor edema. Neurologic:  Normal speech and language. No gross focal neurologic deficits are appreciated. Speech is normal. No gait instability. Skin:  Skin is warm, dry. Diaphoresis is noted Psychiatric: Mood and affect are normal.  ____________________________________________  EKG: Interpreted by me. Normal sinus rhythm the rate  is 75 bpm, normal PR interval, normal QRS, normal QT interval. Normal axis. Hospital anterior infarct age indeterminate.  ____________________________________________  ED COURSE:  Pertinent labs & imaging results that were available during my care of the patient were reviewed by me and considered in my medical decision making (see chart for details). Patient with severe epigastric discomfort, we'll give IV Dilaudid, Zofran and fluids. Uncertain etiology at this time. ____________________________________________    LABS (pertinent positives/negatives)  Labs Reviewed  CBC WITH DIFFERENTIAL/PLATELET - Abnormal; Notable for the following:    Lymphs Abs 0.8 (*)    All other components within normal limits  COMPREHENSIVE METABOLIC PANEL - Abnormal; Notable for the following:    Glucose, Bld 107 (*)    AST 111 (*)    ALT 164 (*)    Alkaline Phosphatase 129 (*)    All other components within normal limits  TROPONIN I  LIPASE, BLOOD  CBC WITH DIFFERENTIAL/PLATELET  URINALYSIS COMPLETEWITH MICROSCOPIC ([facility] ONLY)    RADIOLOGY Images were viewed by me  Acute abdominal series IMPRESSION: No demonstrable bowel obstruction. No evidence of free air. No lung edema or consolidation. IMPRESSION: 1. No acute abdominal or pelvic pathology. 2. 16 mm left adrenal nodule of indeterminate etiology. Recommend further characterization with a nonemergent MR of the abdomen. ____________________________________________  FINAL ASSESSMENT AND PLAN  Epigastric pain, likely peptic ulcer disease  Plan: Patient with labs and imaging as dictated above. Patient likely with peptic ulcer disease that has recurred. I'll restart Pepcid and Carafate. Should be advised to have close outpatient follow-up with gastroenterology. She is in no acute distress this time, pain is relieved. Dr. [name] was her previous gastroenterologist and she can follow-up with him.   Faith Newport, MD   Faith Newport,  MD 12/06/15 [phone]

## 2015-12-13 ENCOUNTER — Other Ambulatory Visit: Payer: Self-pay | Admitting: Nurse Practitioner

## 2015-12-13 DIAGNOSIS — R1013 Epigastric pain: Secondary | ICD-10-CM | POA: Diagnosis not present

## 2015-12-13 DIAGNOSIS — K760 Fatty (change of) liver, not elsewhere classified: Secondary | ICD-10-CM | POA: Diagnosis not present

## 2015-12-13 DIAGNOSIS — E278 Other specified disorders of adrenal gland: Secondary | ICD-10-CM

## 2015-12-13 DIAGNOSIS — R945 Abnormal results of liver function studies: Secondary | ICD-10-CM

## 2015-12-13 DIAGNOSIS — E279 Disorder of adrenal gland, unspecified: Secondary | ICD-10-CM | POA: Diagnosis not present

## 2015-12-16 DIAGNOSIS — R300 Dysuria: Secondary | ICD-10-CM | POA: Diagnosis not present

## 2015-12-16 DIAGNOSIS — N390 Urinary tract infection, site not specified: Secondary | ICD-10-CM | POA: Diagnosis not present

## 2015-12-19 DIAGNOSIS — R945 Abnormal results of liver function studies: Secondary | ICD-10-CM | POA: Diagnosis not present

## 2015-12-19 DIAGNOSIS — E279 Disorder of adrenal gland, unspecified: Secondary | ICD-10-CM | POA: Diagnosis not present

## 2015-12-19 DIAGNOSIS — K760 Fatty (change of) liver, not elsewhere classified: Secondary | ICD-10-CM | POA: Diagnosis not present

## 2015-12-19 DIAGNOSIS — R1013 Epigastric pain: Secondary | ICD-10-CM | POA: Diagnosis not present

## 2016-01-01 ENCOUNTER — Ambulatory Visit
Admission: RE | Admit: 2016-01-01 | Discharge: 2016-01-01 | Disposition: A | Discharge status: Home / Self Care | Payer: PPO | Source: Ambulatory Visit | Attending: Nurse Practitioner | Admitting: Nurse Practitioner

## 2016-01-01 ENCOUNTER — Ambulatory Visit: Payer: PPO

## 2016-01-01 DIAGNOSIS — D3500 Benign neoplasm of unspecified adrenal gland: Secondary | ICD-10-CM | POA: Diagnosis not present

## 2016-01-01 DIAGNOSIS — K760 Fatty (change of) liver, not elsewhere classified: Secondary | ICD-10-CM | POA: Insufficient documentation

## 2016-01-01 DIAGNOSIS — R945 Abnormal results of liver function studies: Secondary | ICD-10-CM

## 2016-01-01 DIAGNOSIS — K7689 Other specified diseases of liver: Secondary | ICD-10-CM | POA: Diagnosis not present

## 2016-01-01 DIAGNOSIS — E279 Disorder of adrenal gland, unspecified: Secondary | ICD-10-CM | POA: Diagnosis not present

## 2016-01-01 DIAGNOSIS — D1803 Hemangioma of intra-abdominal structures: Secondary | ICD-10-CM | POA: Diagnosis not present

## 2016-01-01 DIAGNOSIS — Z9049 Acquired absence of other specified parts of digestive tract: Secondary | ICD-10-CM | POA: Insufficient documentation

## 2016-01-01 DIAGNOSIS — E278 Other specified disorders of adrenal gland: Secondary | ICD-10-CM

## 2016-01-01 DIAGNOSIS — D3502 Benign neoplasm of left adrenal gland: Secondary | ICD-10-CM | POA: Diagnosis not present

## 2016-01-01 MED ORDER — GADOBENATE DIMEGLUMINE 529 MG/ML IV SOLN
20.0000 mL | Freq: Once | INTRAVENOUS | Status: AC | PRN
  Administered 2016-01-01: 20 mL via INTRAVENOUS

## 2016-01-02 ENCOUNTER — Ambulatory Visit
Admission: RE | Admit: 2016-01-02 | Discharge: 2016-01-02 | Disposition: A | Discharge status: Home / Self Care | Payer: PPO | Source: Ambulatory Visit | Attending: Nurse Practitioner | Admitting: Nurse Practitioner

## 2016-01-02 DIAGNOSIS — K760 Fatty (change of) liver, not elsewhere classified: Secondary | ICD-10-CM | POA: Diagnosis not present

## 2016-01-02 DIAGNOSIS — E756 Lipid storage disorder, unspecified: Secondary | ICD-10-CM | POA: Diagnosis not present

## 2016-01-02 DIAGNOSIS — R945 Abnormal results of liver function studies: Secondary | ICD-10-CM | POA: Insufficient documentation

## 2016-01-02 DIAGNOSIS — R932 Abnormal findings on diagnostic imaging of liver and biliary tract: Secondary | ICD-10-CM | POA: Insufficient documentation

## 2016-01-03 ENCOUNTER — Other Ambulatory Visit: Payer: PPO

## 2016-01-04 DIAGNOSIS — Z9889 Other specified postprocedural states: Secondary | ICD-10-CM | POA: Diagnosis not present

## 2016-01-11 ENCOUNTER — Ambulatory Visit
Admission: RE | Admit: 2016-01-11 | Discharge: 2016-01-11 | Disposition: A | Discharge status: Home / Self Care | Payer: PPO | Source: Ambulatory Visit | Attending: Gastroenterology | Admitting: Gastroenterology

## 2016-01-11 ENCOUNTER — Encounter
Admission: RE | Disposition: A | Discharge status: Home / Self Care | Payer: Self-pay | Source: Ambulatory Visit | Attending: Gastroenterology

## 2016-01-11 ENCOUNTER — Encounter: Payer: Self-pay | Admitting: *Deleted

## 2016-01-11 ENCOUNTER — Ambulatory Visit: Payer: PPO | Admitting: Anesthesiology

## 2016-01-11 DIAGNOSIS — Z87891 Personal history of nicotine dependence: Secondary | ICD-10-CM | POA: Insufficient documentation

## 2016-01-11 DIAGNOSIS — E039 Hypothyroidism, unspecified: Secondary | ICD-10-CM | POA: Insufficient documentation

## 2016-01-11 DIAGNOSIS — R1013 Epigastric pain: Secondary | ICD-10-CM | POA: Diagnosis not present

## 2016-01-11 DIAGNOSIS — Z853 Personal history of malignant neoplasm of breast: Secondary | ICD-10-CM | POA: Insufficient documentation

## 2016-01-11 DIAGNOSIS — Z7951 Long term (current) use of inhaled steroids: Secondary | ICD-10-CM | POA: Diagnosis not present

## 2016-01-11 DIAGNOSIS — Z79899 Other long term (current) drug therapy: Secondary | ICD-10-CM | POA: Insufficient documentation

## 2016-01-11 DIAGNOSIS — Z8719 Personal history of other diseases of the digestive system: Secondary | ICD-10-CM | POA: Insufficient documentation

## 2016-01-11 DIAGNOSIS — Z885 Allergy status to narcotic agent status: Secondary | ICD-10-CM | POA: Diagnosis not present

## 2016-01-11 DIAGNOSIS — E785 Hyperlipidemia, unspecified: Secondary | ICD-10-CM | POA: Diagnosis not present

## 2016-01-11 HISTORY — PX: ESOPHAGOGASTRODUODENOSCOPY (EGD) WITH PROPOFOL: SHX5813

## 2016-01-11 SURGERY — ESOPHAGOGASTRODUODENOSCOPY (EGD) WITH PROPOFOL
Anesthesia: General

## 2016-01-11 MED ORDER — SODIUM CHLORIDE 0.9 % IV SOLN: INTRAVENOUS | Status: DC

## 2016-01-11 MED ORDER — SODIUM CHLORIDE 0.9 % IV SOLN
INTRAVENOUS | Status: DC
  Administered 2016-01-11: 08:00:00 via INTRAVENOUS

## 2016-01-11 MED ORDER — LIDOCAINE HCL (CARDIAC) 20 MG/ML IV SOLN
INTRAVENOUS | Status: DC | PRN
  Administered 2016-01-11: 80 mg via INTRAVENOUS

## 2016-01-11 MED ORDER — DIPHENHYDRAMINE HCL 50 MG/ML IJ SOLN
INTRAMUSCULAR | Status: DC | PRN
  Administered 2016-01-11: 12.5 mg via INTRAVENOUS

## 2016-01-11 MED ORDER — MIDAZOLAM HCL 2 MG/2ML IJ SOLN
INTRAMUSCULAR | Status: DC | PRN
  Administered 2016-01-11: 2 mg via INTRAVENOUS

## 2016-01-11 MED ORDER — PROPOFOL 500 MG/50ML IV EMUL
INTRAVENOUS | Status: DC | PRN
  Administered 2016-01-11: 140 ug/kg/min via INTRAVENOUS

## 2016-01-11 NOTE — Anesthesia Preprocedure Evaluation (Signed)
Anesthesia Evaluation  Patient identified by MRN, date of birth, ID band Patient awake    Reviewed: Allergy & Precautions, H&P , NPO status , Patient's Chart, lab work & pertinent test results, reviewed documented beta blocker date and time   Airway Mallampati: III   Neck ROM: full    Dental  (+) Poor Dentition   Pulmonary neg pulmonary ROS, former smoker,    Pulmonary exam normal        Cardiovascular negative cardio ROS Normal cardiovascular exam     Neuro/Psych negative neurological ROS  negative psych ROS   GI/Hepatic negative GI ROS, Neg liver ROS, PUD,   Endo/Other  negative endocrine ROSHypothyroidism   Renal/GU negative Renal ROS  negative genitourinary   Musculoskeletal   Abdominal   Peds  Hematology negative hematology ROS (+)   Anesthesia Other Findings Past Medical History:   Breast cancer ([place])                             2001           Comment:LT LUMPECTOMY   Radiation                                       2001           Comment:LT BREAST   S/P chemotherapy, time since greater than 12 w* 2001           Comment:BREAST CA   Hyperlipidemia                                               Hypothyroidism                                               Gastric ulcer                                                  Comment:H/O Past Surgical History:   BREAST LUMPECTOMY                               Left              CHOLECYSTECTOMY                                               HEMORRHOID SURGERY                                            COLONOSCOPY  ESOPHAGOGASTRODUODENOSCOPY                                    WRIST FRACTURE SURGERY                          Left              CARPAL TUNNEL RELEASE                           Bilateral              SHOULDER ARTHROSCOPY WITH OPEN ROTATOR CUFF RE* Right 05/26/2015      Comment:Procedure: SHOULDER ARTHROSCOPY WITH  OPEN               ROTATOR CUFF REPAIR;  Surgeon: Leanor Kail,              MD;  Location: [place];  Service:               Orthopedics;  Laterality: Right;  subacromial               decompression BMI    Body Mass Index   32.29 kg/m 2     Reproductive/Obstetrics                             Anesthesia Physical Anesthesia Plan  ASA: III  Anesthesia Plan: General   Post-op Pain Management:    Induction:   Airway Management Planned:   Additional Equipment:   Intra-op Plan:   Post-operative Plan:   Informed Consent: I have reviewed the patients History and Physical, chart, labs and discussed the procedure including the risks, benefits and alternatives for the proposed anesthesia with the patient or authorized representative who has indicated his/her understanding and acceptance.   Dental Advisory Given  Plan Discussed with: CRNA  Anesthesia Plan Comments:         Anesthesia Quick Evaluation

## 2016-01-11 NOTE — H&P (Signed)
Primary Care Physician:  Dion Body, MD Primary Gastroenterologist:  Dr. [name]  Pre-Procedure History & Physical: HPI:  Faith Garrett is a 70 y.o. female is here for an EGD.   Past Medical History  Diagnosis Date  . Breast cancer ([place]) 2001    LT LUMPECTOMY  . Radiation 2001    LT BREAST  . S/P chemotherapy, time since greater than 12 weeks 2001    BREAST CA  . Hyperlipidemia   . Hypothyroidism   . Gastric ulcer     H/O    Past Surgical History  Procedure Laterality Date  . Breast lumpectomy Left   . Cholecystectomy    . Hemorrhoid surgery    . Colonoscopy    . Esophagogastroduodenoscopy    . Wrist fracture surgery Left   . Carpal tunnel release Bilateral   . Shoulder arthroscopy with open rotator cuff repair Right 05/26/2015    Procedure: SHOULDER ARTHROSCOPY WITH OPEN ROTATOR CUFF REPAIR;  Surgeon: Leanor Kail, MD;  Location: [place];  Service: Orthopedics;  Laterality: Right;  subacromial decompression    Prior to Admission medications   Medication Sig Start Date End Date Taking? Authorizing Provider  diphenhydrAMINE (SOMINEX) 25 MG tablet Take 25 mg by mouth at bedtime as needed for sleep.   Yes Historical Provider, MD  clobetasol cream (TEMOVATE) [id number] % Apply 1 application topically as needed.    Historical Provider, MD  famotidine (PEPCID) 20 MG tablet Take 1 tablet (20 mg total) by mouth daily. 12/06/15 12/05/16  [name], MD  fexofenadine-pseudoephedrine (ALLEGRA-D) 60-120 MG per tablet Take 1 tablet by mouth as needed.    Historical Provider, MD  fluticasone (FLONASE) 50 MCG/ACT nasal spray Place 2 sprays into both nostrils daily.    Historical Provider, MD  levothyroxine (SYNTHROID, LEVOTHROID) 137 MCG tablet Take 137 mcg by mouth daily before breakfast. Monday through Friday    Historical Provider, MD  levothyroxine (SYNTHROID, LEVOTHROID) 150 MCG tablet Take 150 mcg by mouth daily before breakfast. Saturday and Sunday    Historical  Provider, MD  lovastatin (MEVACOR) 20 MG tablet Take 20 mg by mouth daily. AM    Historical Provider, MD  omeprazole (PRILOSEC) 40 MG capsule Take 1 capsule (40 mg total) by mouth daily. 12/06/15 12/05/16  [name], MD  oxyCODONE-acetaminophen (PERCOCET) 5-325 MG tablet Take 2 tablets by mouth every 6 (six) hours as needed for moderate pain or severe pain. 12/06/15   [name], MD  Potassium 99 MG TABS Take by mouth. AM    Historical Provider, MD  pyridOXINE (VITAMIN B-6) 50 MG tablet Take 50 mg by mouth daily.    Historical Provider, MD  sucralfate (CARAFATE) 1 g tablet Take 1 tablet (1 g total) by mouth 4 (four) times daily. 12/06/15 12/05/16  [name], MD    Allergies as of 01/01/2016 - Review Complete 12/06/2015  Allergen Reaction Noted  . Other Anaphylaxis 05/15/2015  . Codeine sulfate Itching 05/15/2015    History reviewed. No pertinent family history.  Social History   Social History  . Marital Status: Married    Spouse Name: N/A  . Number of Children: N/A  . Years of Education: N/A   Occupational History  . Not on file.   Social History Main Topics  . Smoking status: Former Smoker    Quit date: 10/28/1966  . Smokeless tobacco: Not on file  . Alcohol Use: No  . Drug Use: No  . Sexual Activity:  Not on file   Other Topics Concern  . Not on file   Social History Narrative    Review of Systems: See HPI, otherwise negative ROS  Physical Exam: BP 103/50 mmHg  Pulse 92  Temp(Src) 97.5 F (36.4 C) (Tympanic)  Resp 18  Ht 5\' 6"  (1.676 m)  Wt 90.719 kg (200 lb)  BMI 32.30 kg/m2  SpO2 99% General:   Alert,  pleasant and cooperative in NAD Head:  Normocephalic and atraumatic. Neck:  Supple; no masses or thyromegaly. Lungs:  Clear throughout to auscultation.    Heart:  Regular rate and rhythm. Abdomen:  Soft, nontender and nondistended. Normal bowel sounds, without guarding, and without rebound.   Neurologic:  Alert and  oriented x4;   grossly normal neurologically.  Impression/Plan: Faith Garrett is here for an EGD to be performed for epigastric pain.  Risks, benefits, limitations, and alternatives regarding  EGD have been reviewed with the patient.  Questions have been answered.  All parties agreeable.   Faith Garrett, Faith Dawn, MD  01/11/2016, 8:28 AM

## 2016-01-11 NOTE — Anesthesia Postprocedure Evaluation (Signed)
Anesthesia Post Note  Patient: Faith Garrett  Procedure(s) Performed: Procedure(s) (LRB): ESOPHAGOGASTRODUODENOSCOPY (EGD) WITH PROPOFOL (N/A)  Patient location during evaluation: PACU Anesthesia Type: General Level of consciousness: awake and alert Pain management: pain level controlled Vital Signs Assessment: post-procedure vital signs reviewed and stable Respiratory status: spontaneous breathing, nonlabored ventilation, respiratory function stable and patient connected to nasal cannula oxygen Cardiovascular status: blood pressure returned to baseline and stable Postop Assessment: no signs of nausea or vomiting Anesthetic complications: no    Last Vitals:  Filed Vitals:   01/11/16 0844 01/11/16 0904  BP: 116/70 119/56  Pulse: 84 79  Temp: 37.1 C   Resp: 16 16    Last Pain: There were no vitals filed for this visit.               Molli Barrows

## 2016-01-11 NOTE — Op Note (Signed)
[facility] Gastroenterology Patient Name: Faith Garrett Procedure Date: 01/11/2016 8:32 AM MRN: [id number] Account #: [id number] Date of Birth: [birth year] Admit Type: Outpatient Age: 70 Room: [facility] ENDO ROOM 4 Gender: Female Note Status: Finalized Procedure:            Upper GI endoscopy Indications:          Epigastric abdominal pain, Hx of gastric ulcer Providers:            Lupita Dawn. [name], MD Referring MD:         Dion Body (Referring MD) Medicines:            Monitored Anesthesia Care Complications:        No immediate complications. Procedure:            Pre-Anesthesia Assessment:                       - Prior to the procedure, a History and Physical was                        performed, and patient medications, allergies and                        sensitivities were reviewed. The patient's tolerance of                        previous anesthesia was reviewed.                       - The risks and benefits of the procedure and the                        sedation options and risks were discussed with the                        patient. All questions were answered and informed                        consent was obtained.                       - After reviewing the risks and benefits, the patient                        was deemed in satisfactory condition to undergo the                        procedure.                       After obtaining informed consent, the endoscope was                        passed under direct vision. Throughout the procedure,                        the patient's blood pressure, pulse, and oxygen                        saturations were monitored continuously. The Endoscope  was introduced through the mouth, and advanced to the                        second part of duodenum. The upper GI endoscopy was                        accomplished without difficulty. The patient tolerated                        the procedure  well. Findings:      The examined esophagus was normal.      The entire examined stomach was normal.      The examined duodenum was normal. Impression:           - Normal esophagus.                       - Normal stomach.                       - Normal examined duodenum.                       - No specimens collected.                       - May have had a stone that passed through. Recommendation:       - Discharge patient to home.                       - The findings and recommendations were discussed with                        the patient. Procedure Code(s):    --- Professional ---                       [phone], Esophagogastroduodenoscopy, flexible, transoral;                        diagnostic, including collection of specimen(s) by                        brushing or washing, when performed (separate procedure) Diagnosis Code(s):    --- Professional ---                       R10.13, Epigastric pain CPT copyright 2016 [employer]. All rights reserved. The codes documented in this report are preliminary and upon coder review may  be revised to meet current compliance requirements. Hulen Luster, MD 01/11/2016 8:40:33 AM This report has been signed electronically. Number of Addenda: 0 Note Initiated On: 01/11/2016 8:32 AM      [facility]

## 2016-01-11 NOTE — Transfer of Care (Signed)
Immediate Anesthesia Transfer of Care Note  Patient: Faith Garrett  Procedure(s) Performed: Procedure(s): ESOPHAGOGASTRODUODENOSCOPY (EGD) WITH PROPOFOL (N/A)  Patient Location: PACU and Endoscopy Unit  Anesthesia Type:General  Level of Consciousness: sedated  Airway & Oxygen Therapy: Patient Spontanous Breathing and Patient connected to nasal cannula oxygen  Post-op Assessment: Report given to RN and Post -op Vital signs reviewed and stable  Post vital signs: Reviewed and stable  Last Vitals: 0845: 83 hr 99% 23 r 116/70 98.8 temp Filed Vitals:   01/11/16 0801  BP: 103/50  Pulse: 92  Temp: 36.4 C  Resp: 18    Complications: No apparent anesthesia complications

## 2016-01-16 ENCOUNTER — Other Ambulatory Visit: Payer: Self-pay | Admitting: Nurse Practitioner

## 2016-01-16 DIAGNOSIS — R945 Abnormal results of liver function studies: Secondary | ICD-10-CM

## 2016-01-16 DIAGNOSIS — K7400 Hepatic fibrosis, unspecified: Secondary | ICD-10-CM

## 2016-01-16 DIAGNOSIS — R7989 Other specified abnormal findings of blood chemistry: Secondary | ICD-10-CM

## 2016-01-17 ENCOUNTER — Encounter: Payer: Self-pay | Admitting: Gastroenterology

## 2016-01-23 ENCOUNTER — Other Ambulatory Visit: Payer: Self-pay | Admitting: General Surgery

## 2016-01-23 ENCOUNTER — Other Ambulatory Visit: Payer: Self-pay | Admitting: Radiology

## 2016-01-24 ENCOUNTER — Ambulatory Visit
Admission: RE | Admit: 2016-01-24 | Discharge: 2016-01-24 | Disposition: A | Discharge status: Home / Self Care | Payer: PPO | Source: Ambulatory Visit | Attending: Diagnostic Radiology | Admitting: Diagnostic Radiology

## 2016-01-24 ENCOUNTER — Ambulatory Visit
Admission: RE | Admit: 2016-01-24 | Discharge: 2016-01-24 | Disposition: A | Discharge status: Home / Self Care | Payer: PPO | Source: Ambulatory Visit | Attending: Nurse Practitioner | Admitting: Nurse Practitioner

## 2016-01-24 DIAGNOSIS — R945 Abnormal results of liver function studies: Secondary | ICD-10-CM | POA: Diagnosis not present

## 2016-01-24 DIAGNOSIS — K7400 Hepatic fibrosis, unspecified: Secondary | ICD-10-CM

## 2016-01-24 DIAGNOSIS — Z9889 Other specified postprocedural states: Secondary | ICD-10-CM | POA: Insufficient documentation

## 2016-01-24 DIAGNOSIS — R7989 Other specified abnormal findings of blood chemistry: Secondary | ICD-10-CM | POA: Diagnosis not present

## 2016-01-24 DIAGNOSIS — R109 Unspecified abdominal pain: Secondary | ICD-10-CM | POA: Diagnosis not present

## 2016-01-24 DIAGNOSIS — R52 Pain, unspecified: Secondary | ICD-10-CM | POA: Insufficient documentation

## 2016-01-24 DIAGNOSIS — K7581 Nonalcoholic steatohepatitis (NASH): Secondary | ICD-10-CM | POA: Diagnosis not present

## 2016-01-24 HISTORY — DX: Gastro-esophageal reflux disease without esophagitis: K21.9

## 2016-01-24 LAB — CBC
HEMATOCRIT: 40.3 % (ref 35.0–47.0)
Hemoglobin: 13.6 g/dL (ref 12.0–16.0)
MCH: 29.9 pg (ref 26.0–34.0)
MCHC: 33.9 g/dL (ref 32.0–36.0)
MCV: 88.2 fL (ref 80.0–100.0)
PLATELETS: 254 10*3/uL (ref 150–440)
RBC: 4.57 MIL/uL (ref 3.80–5.20)
RDW: 13.2 % (ref 11.5–14.5)
WBC: 4.6 10*3/uL (ref 3.6–11.0)

## 2016-01-24 LAB — PROTIME-INR
INR: 0.97
Prothrombin Time: 13.1 seconds (ref 11.4–15.0)

## 2016-01-24 LAB — APTT: APTT: 28 s (ref 24–36)

## 2016-01-24 MED ORDER — FENTANYL CITRATE (PF) 100 MCG/2ML IJ SOLN
INTRAMUSCULAR | Status: AC | PRN
  Administered 2016-01-24 (×2): 50 ug via INTRAVENOUS

## 2016-01-24 MED ORDER — ONDANSETRON HCL 4 MG/5ML PO SOLN
4.0000 mg | Freq: Once | ORAL | Status: DC
Start: 2016-01-24 — End: 2016-01-24

## 2016-01-24 MED ORDER — SODIUM CHLORIDE 0.9 % IV SOLN
INTRAVENOUS | Status: DC
  Administered 2016-01-24: 08:00:00 via INTRAVENOUS

## 2016-01-24 MED ORDER — SODIUM CHLORIDE 0.9 % IV SOLN
4.0000 mg | Freq: Once | INTRAVENOUS | Status: AC
  Administered 2016-01-24: 4 mg via INTRAVENOUS
  Filled 2016-01-24: qty 2

## 2016-01-24 MED ORDER — HYDROCODONE-ACETAMINOPHEN 5-325 MG PO TABS
1.0000 | ORAL_TABLET | ORAL | Status: DC | PRN
  Administered 2016-01-24: 1 via ORAL
  Filled 2016-01-24: qty 2

## 2016-01-24 MED ORDER — ONDANSETRON HCL 40 MG/20ML IJ SOLN: 4.0000 mg | Freq: Once | INTRAMUSCULAR | Status: DC

## 2016-01-24 MED ORDER — HYDROMORPHONE HCL 1 MG/ML IJ SOLN
1.0000 mg | Freq: Once | INTRAMUSCULAR | Status: AC
  Administered 2016-01-24: 1 mg via INTRAVENOUS

## 2016-01-24 MED ORDER — MIDAZOLAM HCL 5 MG/5ML IJ SOLN
INTRAMUSCULAR | Status: AC | PRN
  Administered 2016-01-24: 1 mg via INTRAVENOUS

## 2016-01-24 MED ORDER — FENTANYL CITRATE (PF) 100 MCG/2ML IJ SOLN
50.0000 ug | Freq: Once | INTRAMUSCULAR | Status: AC
  Administered 2016-01-24: 50 ug via INTRAVENOUS

## 2016-01-24 MED ORDER — ONDANSETRON HCL 4 MG/2ML IJ SOLN
4.0000 mg | Freq: Once | INTRAMUSCULAR | Status: AC
  Administered 2016-01-24: 4 mg via INTRAVENOUS
  Filled 2016-01-24: qty 2

## 2016-01-24 NOTE — Procedures (Signed)
US liver biopsy  Complications:  None  Blood Loss: none  See dictation in canopy pacs  

## 2016-01-24 NOTE — Progress Notes (Signed)
At 1015 patient c/o 4-5:10 pain in procedure site.  Dr. Golden Circle notified.  Norco 1 tab p.o. Given-see eMar.  Pain rapidly increased to 10:10 pain-pt. Calling out and very restless.  VS remained stable.  Dr. Golden Circle notified.  Fentanyl 50 mg IV given.  Unreleived and c/o nausea. Zofran 4 mg IV given and Dr. Golden Circle notified. Portable US done-reviewed by Dr. Golden Circle who reported no bleeding/hematoma.  Pt. Continued to c/o `10:10 pain and restless.  Dilaudid 1 mg IV given.  VS's remained stable throughout.  Pt. Now states 6:10 pain and quiet.  O2 placed-pt. Dozing off and on with desat when asleep.

## 2016-01-25 ENCOUNTER — Ambulatory Visit: Admission: RE | Admit: 2016-01-25 | Payer: PPO | Source: Ambulatory Visit

## 2016-01-29 LAB — SURGICAL PATHOLOGY

## 2016-01-30 DIAGNOSIS — K7689 Other specified diseases of liver: Secondary | ICD-10-CM | POA: Diagnosis not present

## 2016-01-30 DIAGNOSIS — R1013 Epigastric pain: Secondary | ICD-10-CM | POA: Diagnosis not present

## 2016-01-30 DIAGNOSIS — K760 Fatty (change of) liver, not elsewhere classified: Secondary | ICD-10-CM | POA: Diagnosis not present

## 2016-02-02 DIAGNOSIS — K760 Fatty (change of) liver, not elsewhere classified: Secondary | ICD-10-CM | POA: Diagnosis not present

## 2016-02-02 DIAGNOSIS — K7689 Other specified diseases of liver: Secondary | ICD-10-CM | POA: Diagnosis not present

## 2016-02-15 DIAGNOSIS — K760 Fatty (change of) liver, not elsewhere classified: Secondary | ICD-10-CM | POA: Diagnosis not present

## 2016-02-15 DIAGNOSIS — R945 Abnormal results of liver function studies: Secondary | ICD-10-CM | POA: Diagnosis not present

## 2016-04-11 DIAGNOSIS — E7800 Pure hypercholesterolemia, unspecified: Secondary | ICD-10-CM | POA: Diagnosis not present

## 2016-04-11 DIAGNOSIS — E039 Hypothyroidism, unspecified: Secondary | ICD-10-CM | POA: Diagnosis not present

## 2016-04-11 DIAGNOSIS — Z0000 Encounter for general adult medical examination without abnormal findings: Secondary | ICD-10-CM | POA: Diagnosis not present

## 2016-04-17 DIAGNOSIS — E7800 Pure hypercholesterolemia, unspecified: Secondary | ICD-10-CM | POA: Diagnosis not present

## 2016-04-17 DIAGNOSIS — E039 Hypothyroidism, unspecified: Secondary | ICD-10-CM | POA: Diagnosis not present

## 2016-04-17 DIAGNOSIS — E6609 Other obesity due to excess calories: Secondary | ICD-10-CM | POA: Diagnosis not present

## 2016-05-13 DIAGNOSIS — K760 Fatty (change of) liver, not elsewhere classified: Secondary | ICD-10-CM | POA: Diagnosis not present

## 2016-05-15 DIAGNOSIS — H2513 Age-related nuclear cataract, bilateral: Secondary | ICD-10-CM | POA: Diagnosis not present

## 2016-05-22 DIAGNOSIS — L403 Pustulosis palmaris et plantaris: Secondary | ICD-10-CM | POA: Diagnosis not present

## 2016-06-17 DIAGNOSIS — E039 Hypothyroidism, unspecified: Secondary | ICD-10-CM | POA: Diagnosis not present

## 2016-07-10 ENCOUNTER — Other Ambulatory Visit: Payer: Self-pay | Admitting: Family Medicine

## 2016-07-10 DIAGNOSIS — Z1231 Encounter for screening mammogram for malignant neoplasm of breast: Secondary | ICD-10-CM

## 2016-07-23 ENCOUNTER — Ambulatory Visit
Admission: RE | Admit: 2016-07-23 | Discharge: 2016-07-23 | Disposition: A | Discharge status: Home / Self Care | Payer: PPO | Source: Ambulatory Visit | Attending: Family Medicine | Admitting: Family Medicine

## 2016-07-23 DIAGNOSIS — R928 Other abnormal and inconclusive findings on diagnostic imaging of breast: Secondary | ICD-10-CM | POA: Insufficient documentation

## 2016-07-23 DIAGNOSIS — Z1231 Encounter for screening mammogram for malignant neoplasm of breast: Secondary | ICD-10-CM | POA: Diagnosis not present

## 2016-07-24 ENCOUNTER — Other Ambulatory Visit: Payer: Self-pay | Admitting: Family Medicine

## 2016-07-24 DIAGNOSIS — N6310 Unspecified lump in the right breast, unspecified quadrant: Secondary | ICD-10-CM

## 2016-08-08 ENCOUNTER — Ambulatory Visit
Admission: RE | Admit: 2016-08-08 | Discharge: 2016-08-08 | Disposition: A | Discharge status: Home / Self Care | Payer: PPO | Source: Ambulatory Visit | Attending: Family Medicine | Admitting: Family Medicine

## 2016-08-08 DIAGNOSIS — N6310 Unspecified lump in the right breast, unspecified quadrant: Secondary | ICD-10-CM | POA: Insufficient documentation

## 2016-08-08 DIAGNOSIS — R928 Other abnormal and inconclusive findings on diagnostic imaging of breast: Secondary | ICD-10-CM | POA: Diagnosis not present

## 2016-09-09 DIAGNOSIS — E039 Hypothyroidism, unspecified: Secondary | ICD-10-CM | POA: Diagnosis not present

## 2016-10-29 DIAGNOSIS — Z0000 Encounter for general adult medical examination without abnormal findings: Secondary | ICD-10-CM | POA: Diagnosis not present

## 2016-10-29 DIAGNOSIS — E7800 Pure hypercholesterolemia, unspecified: Secondary | ICD-10-CM | POA: Diagnosis not present

## 2016-10-30 DIAGNOSIS — Z0000 Encounter for general adult medical examination without abnormal findings: Secondary | ICD-10-CM | POA: Diagnosis not present

## 2016-10-30 DIAGNOSIS — R030 Elevated blood-pressure reading, without diagnosis of hypertension: Secondary | ICD-10-CM | POA: Diagnosis not present

## 2016-10-30 DIAGNOSIS — E039 Hypothyroidism, unspecified: Secondary | ICD-10-CM | POA: Diagnosis not present

## 2016-10-30 DIAGNOSIS — E7800 Pure hypercholesterolemia, unspecified: Secondary | ICD-10-CM | POA: Diagnosis not present

## 2016-10-30 DIAGNOSIS — Z23 Encounter for immunization: Secondary | ICD-10-CM | POA: Diagnosis not present

## 2017-01-17 IMAGING — US US ABDOMEN LIMITED
1 series · 14 of 25 positions shown · non-contrast
Comparison: June 19, 2010

CLINICAL DATA: Elevated liver enzymes

EXAM:
US ABDOMEN LIMITED - RIGHT UPPER QUADRANT

[Series 1: us abdomen limited · 0.26mm/px · 14 of 35 slices shown]
[im 1/35]
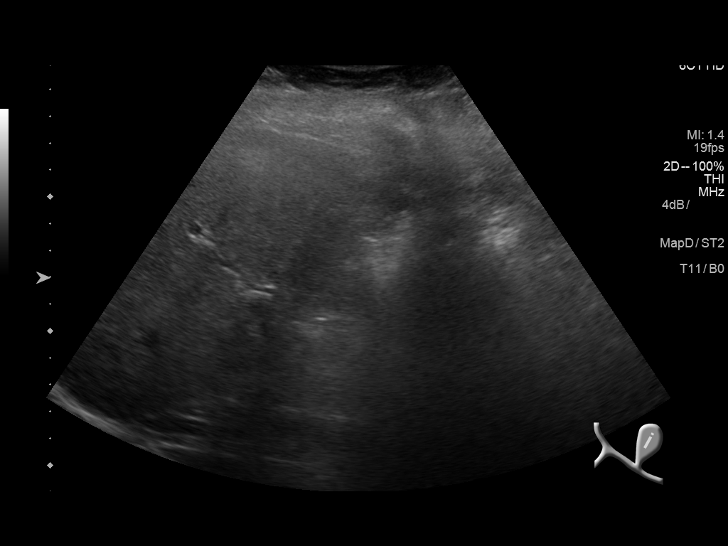
[im 3/35]
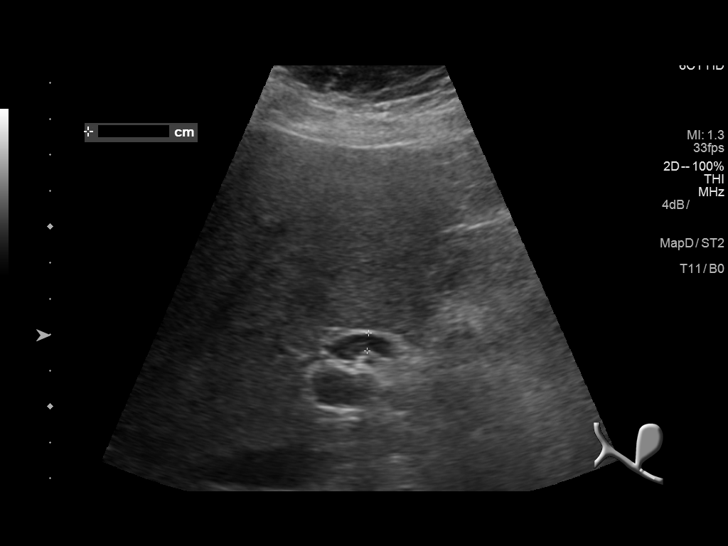
[im 6/35]
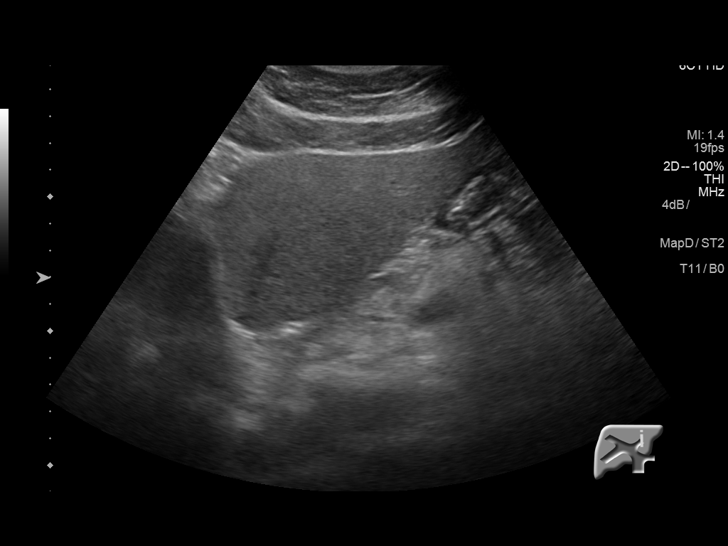
[im 9/35]
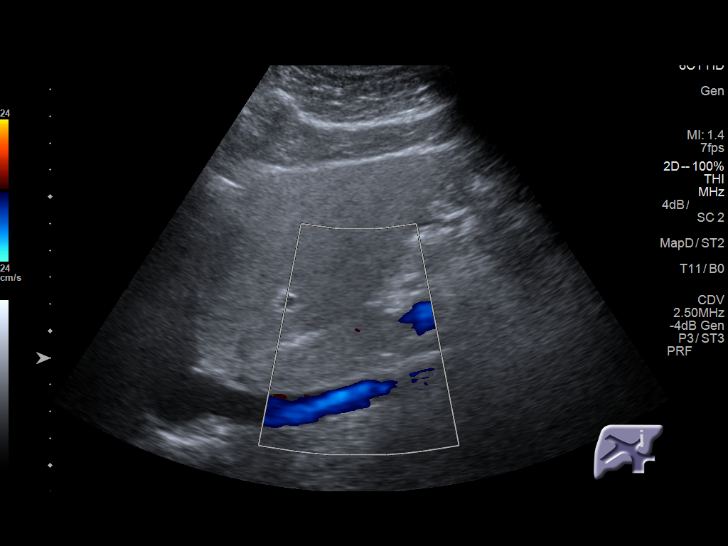
[im 12/35]
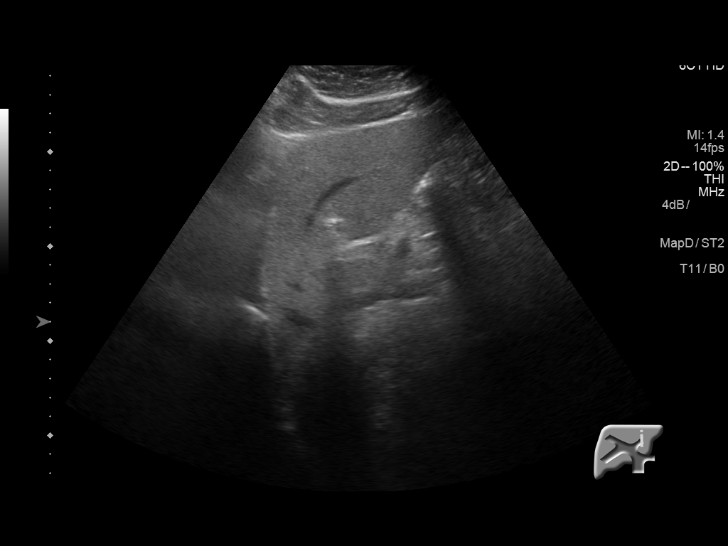
[im 13/35]
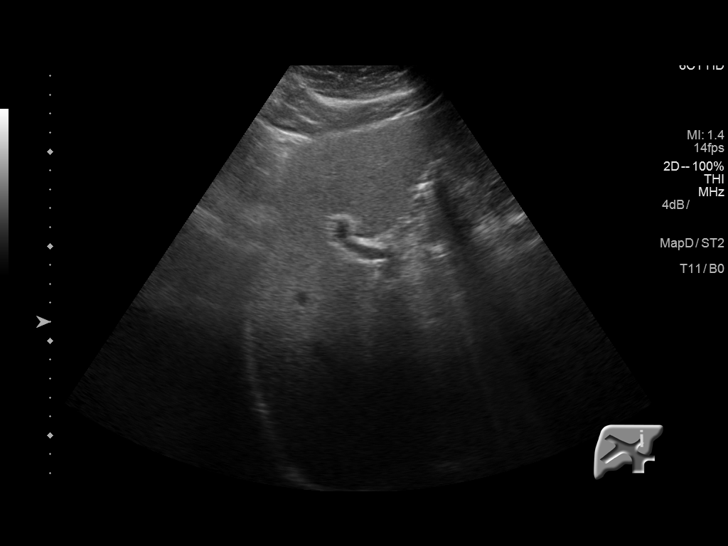
[im 16/35]
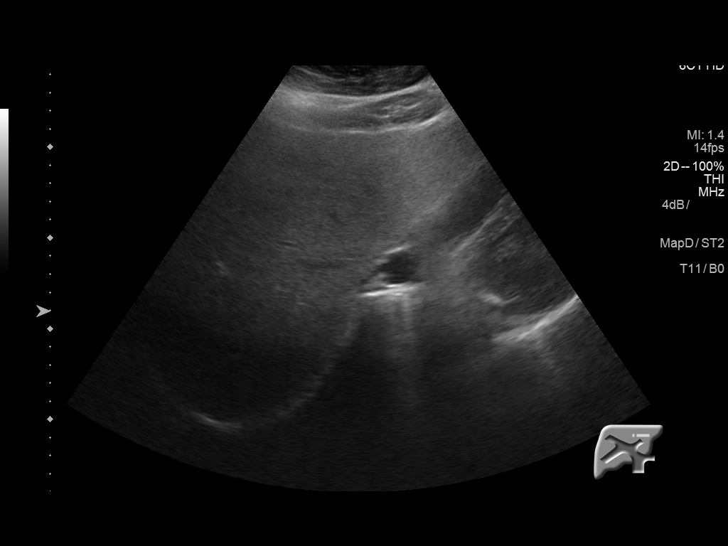
[im 19/35]
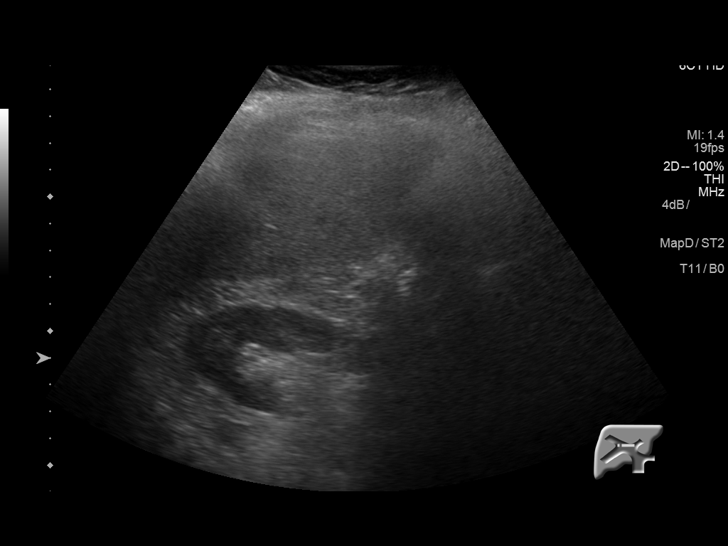
[im 22/35]
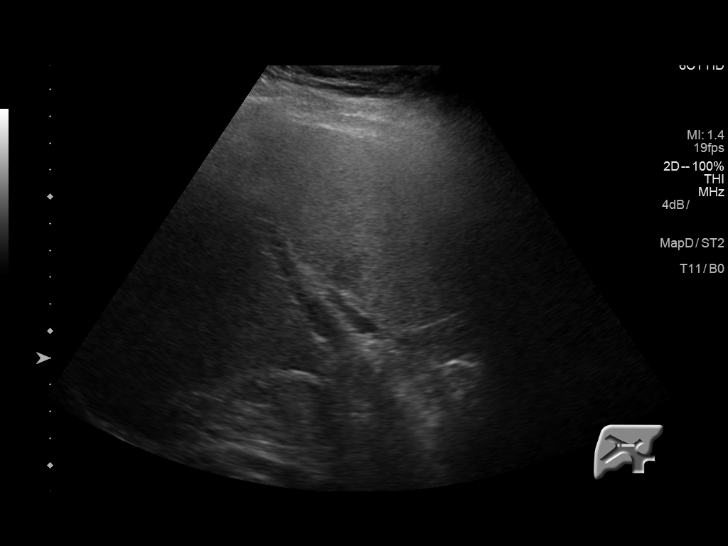
[im 23/35]
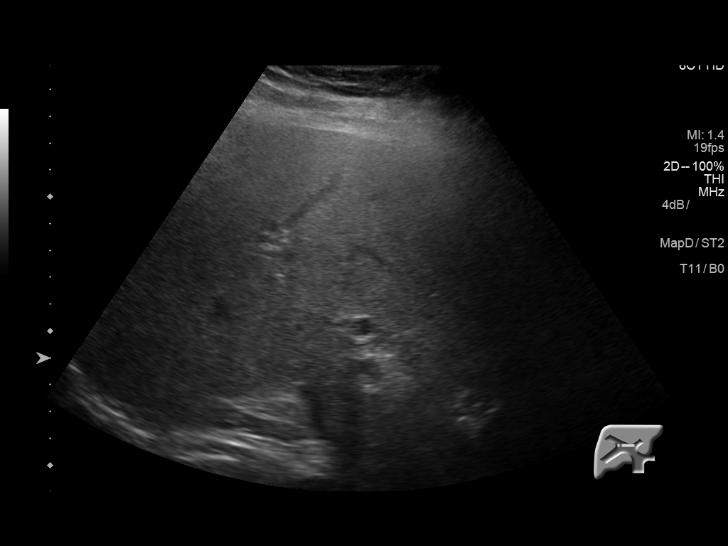
[im 26/35]
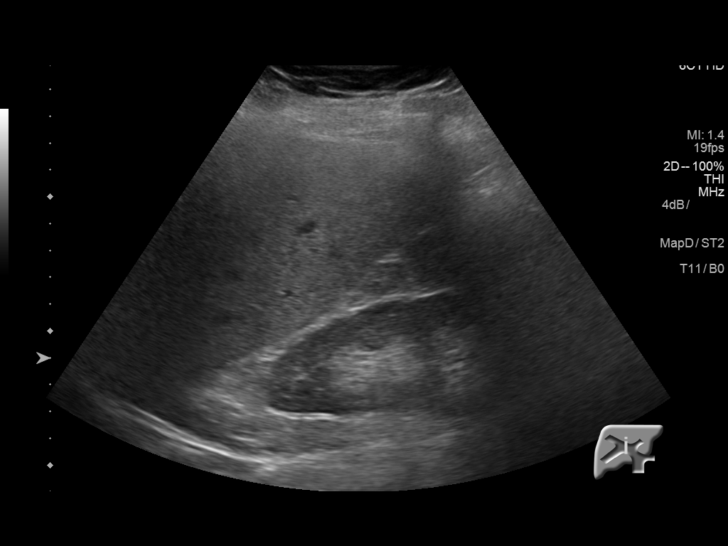
[im 29/35]
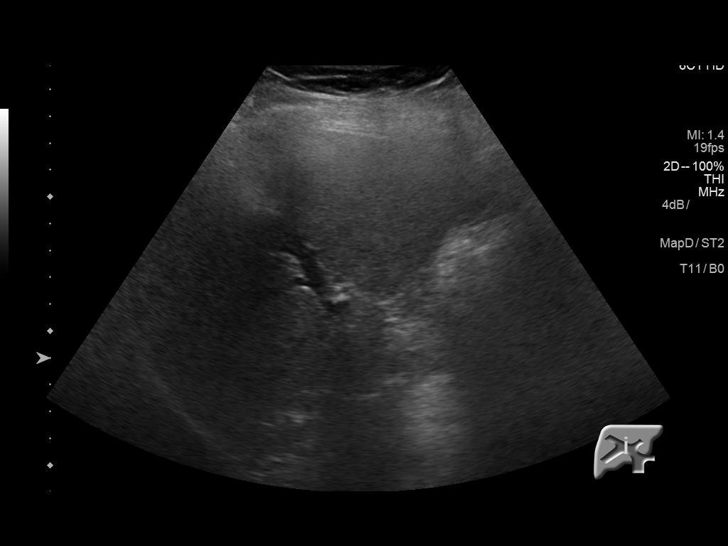
[im 32/35]
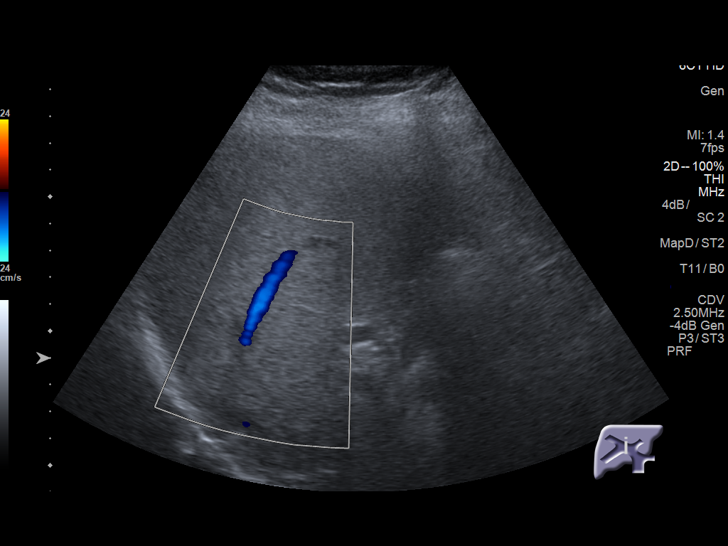
[im 35/35]
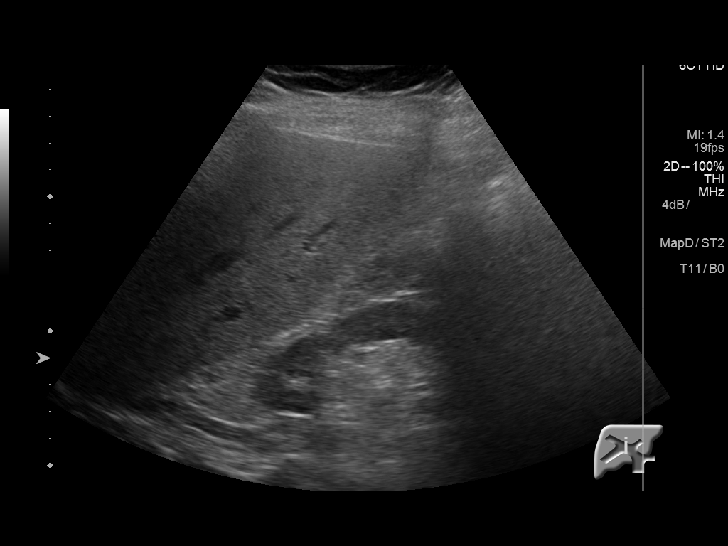

[14 of 25 positions shown; findings below may reference images not displayed]

FINDINGS: Gallbladder:

Surgically absent.

Common bile duct:

Diameter: 5 mm. There is no intrahepatic or extrahepatic biliary
duct dilatation.

Liver:

No focal lesion identified. Liver echogenicity overall is increased.
IMPRESSION: Gallbladder absent. Overall liver echogenicity is increased, likely
due to hepatic steatosis. While no focal liver lesions are
identified, it must be cautioned that the sensitivity of ultrasound
for focal liver lesions is diminished in this circumstance.

## 2017-03-24 IMAGING — MR MR ABDOMEN WO/W CM
13 of 19 series · 30 of 48 positions shown · IV contrast (multihance)
Comparison: 12/06/2015 CT abdomen/pelvis.

CLINICAL DATA: Indeterminate left adrenal nodule on recent CT
study. Prior cholecystectomy.

EXAM:
MRI ABDOMEN WITHOUT AND WITH CONTRAST
TECHNIQUE: Multiplanar multisequence MR imaging of the abdomen was performed
both before and after the administration of intravenous contrast.
CONTRAST:  20mL MULTIHANCE GADOBENATE DIMEGLUMINE 529 MG/ML IV SOLN

[Series 3: T2 · coronal · 8.0mm · 1.64mm/px · 2 of 25 slices shown]
[im 1/25]
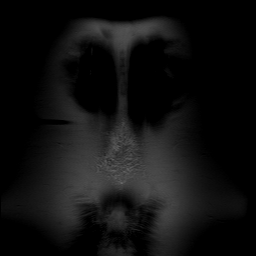
[im 25/25]
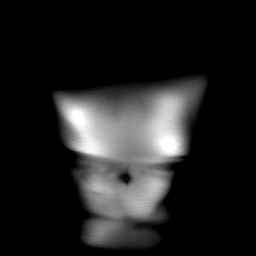

[Series 4: T2 fat-sat · axial · 8.0mm · 0.74mm/px · z∈[-39,+220]mm · 2 of 28 slices shown]
[im 1/28]
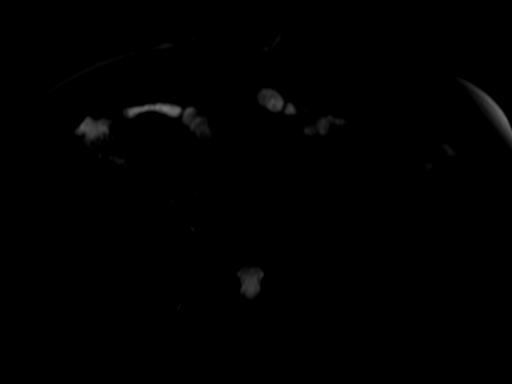
[im 28/28]
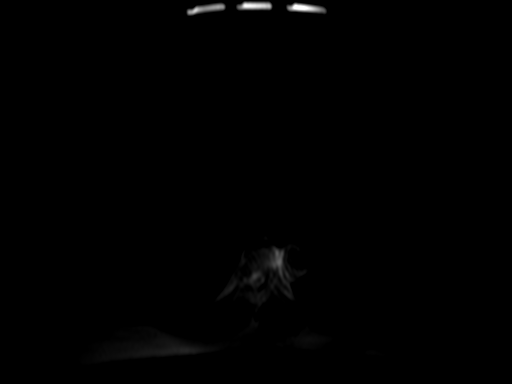

[Series 7: DWI · axial · 6.0mm · 2.97mm/px · z∈[-35,+217]mm · 6 of 103 slices shown]
[im 1/103]
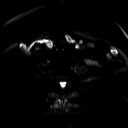
[im 21/103]
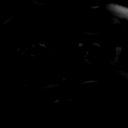
[im 41/103]
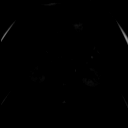
[im 62/103]
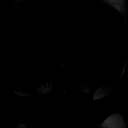
[im 82/103]
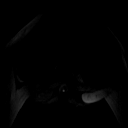
[im 103/103]
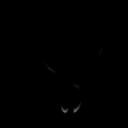

[Series 8: axial dwi_adc · axial · 6.0mm · 2.97mm/px · z∈[-35,+217]mm · 3 of 33 slices shown]
[im 1/33]
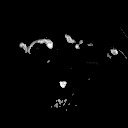
[im 17/33]
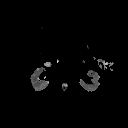
[im 33/33]
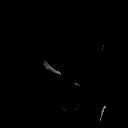

[Series 14: T2 post-contrast · axial · 8.0mm · 1.48mm/px · 1 of 28 slices shown]
[im 1/28]
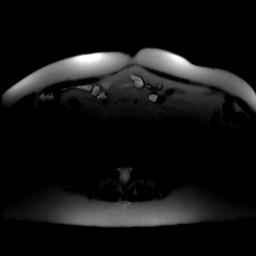

[Series 16: ax out of · axial · 8.0mm · 0.74mm/px · 1 of 28 slices shown]
[im 1/28]
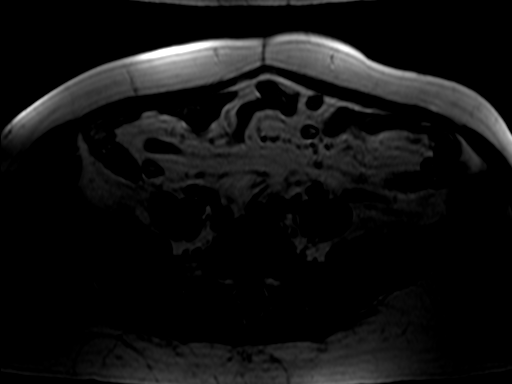

[Series 17: ax in phase · axial · 8.0mm · 0.74mm/px · 1 of 28 slices shown]
[im 1/28]
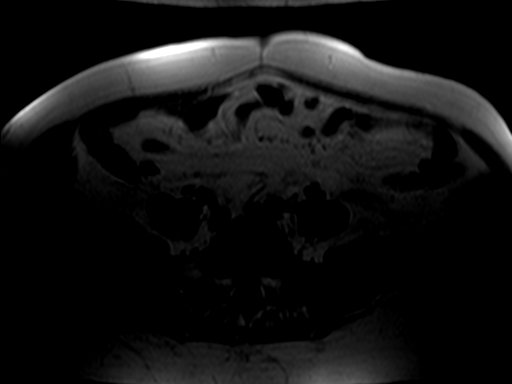

[Series 18: cor out of · coronal · 8.0mm · 0.82mm/px · 1 of 25 slices shown]
[im 1/25]
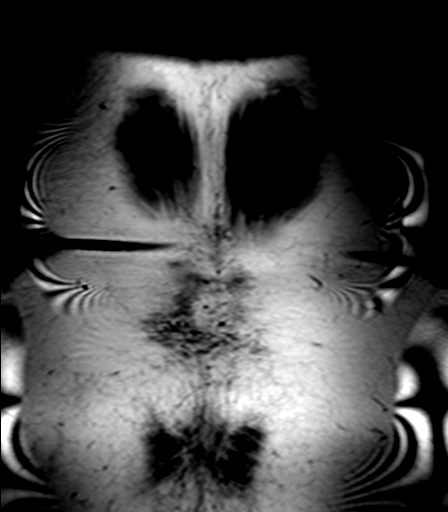

[Series 19: cor in phase · coronal · 8.0mm · 0.82mm/px · 1 of 25 slices shown]
[im 1/25]
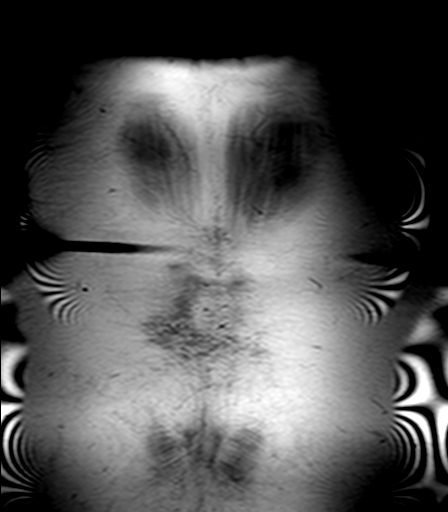

[Series 20: sub 23 sec · axial · 4.0mm · 0.74mm/px · z∈[-35,+217]mm · 3 of 64 slices shown]
[im 1/64]
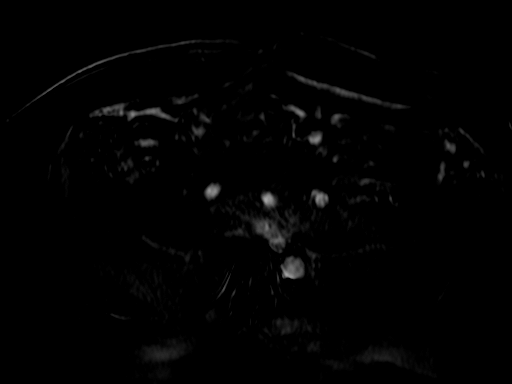
[im 32/64]
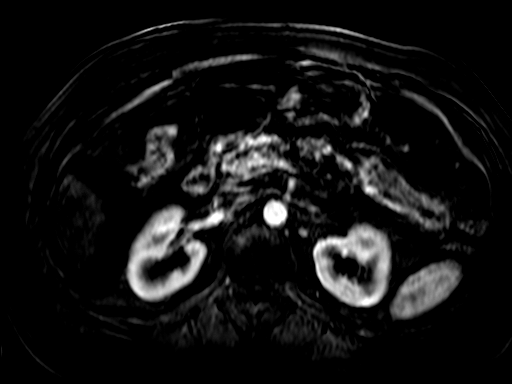
[im 64/64]
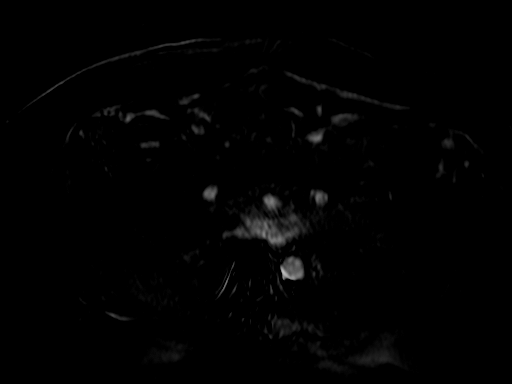

[Series 21: sub 45 sec · axial · 4.0mm · 0.74mm/px · z∈[-35,+217]mm · 3 of 64 slices shown]
[im 1/64]
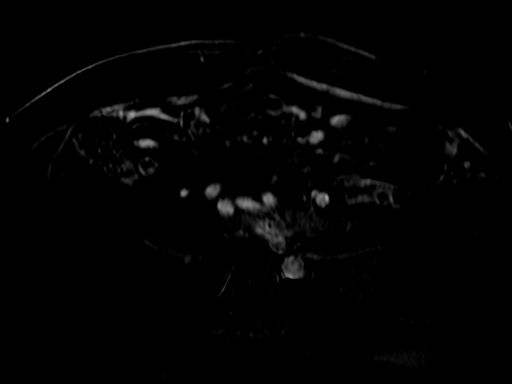
[im 32/64]
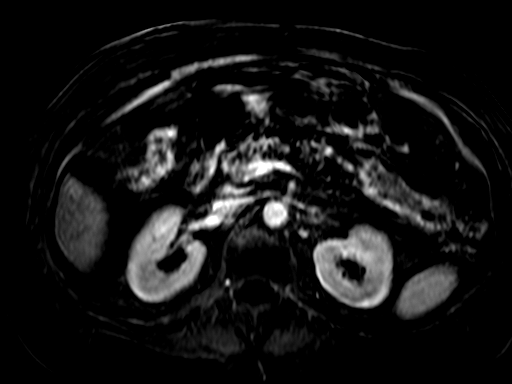
[im 64/64]
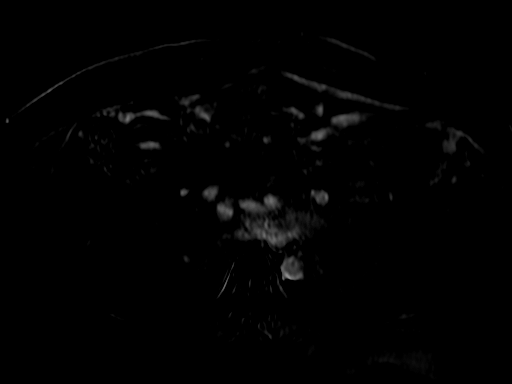

[Series 22: sub 90 sec · axial · 4.0mm · 0.74mm/px · z∈[-35,+217]mm · 3 of 64 slices shown]
[im 1/64]
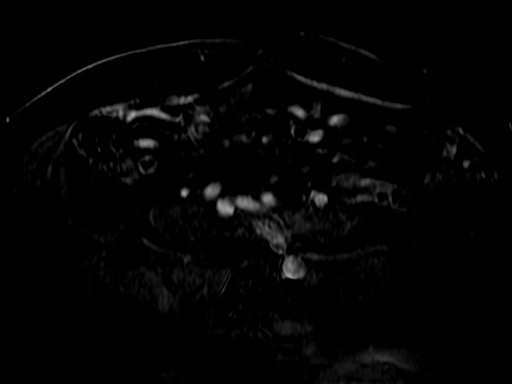
[im 32/64]
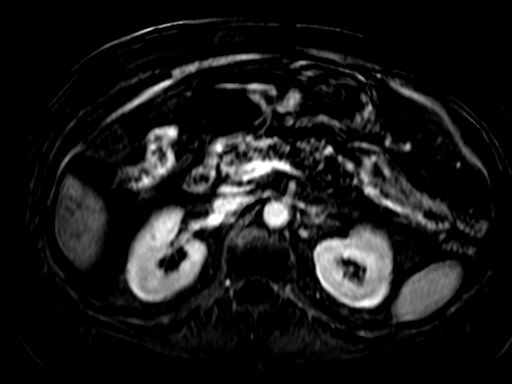
[im 64/64]
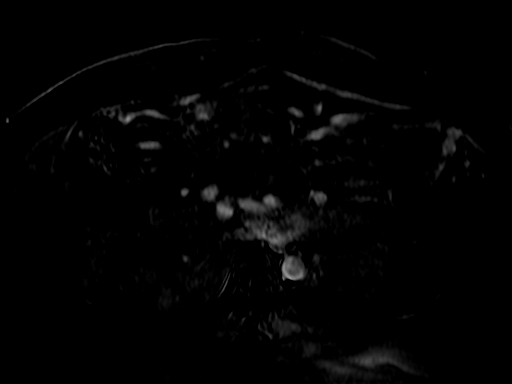

[Series 23: sub 3 min · axial · 4.0mm · 0.74mm/px · z∈[-35,+217]mm · 3 of 64 slices shown]
[im 1/64]
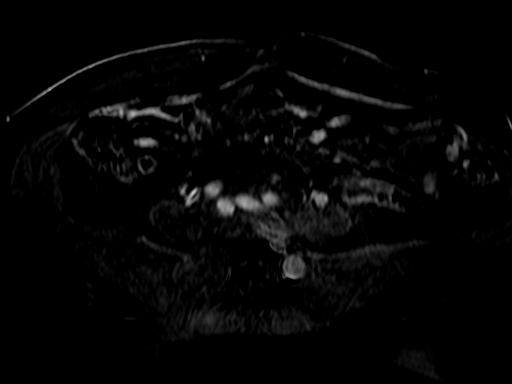
[im 32/64]
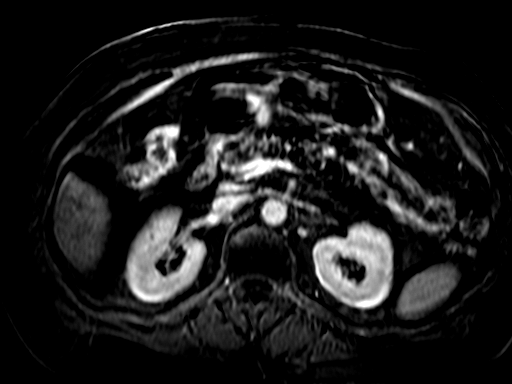
[im 64/64]
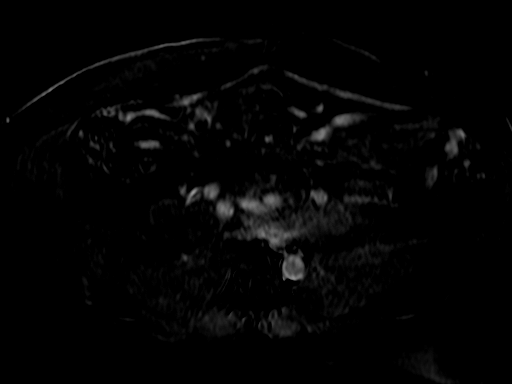

[30 of 48 positions shown; findings below may reference images not displayed]

FINDINGS: Lower chest: Clear lung bases.

Hepatobiliary: Normal liver size and configuration. Minimal diffuse
hepatic steatosis. There is a 0.6 cm hyperenhancing segment 8 right
liver lobe mass with mild T2 hyperintensity (series 10/image 17),
stable since 10/11/2004 MRI, most in keeping with a benign flash
filling hemangioma. There is a simple posterior right liver lobe
cm cyst. No new liver masses. Cholecystectomy. No biliary ductal
dilatation. Common bile duct diameter 6 mm, within expected post
cholecystectomy limits. No choledocholithiasis.

Pancreas: No pancreatic mass or duct dilation.  No pancreas divisum.

Spleen: Normal size. No mass.

Adrenals/Urinary Tract: Normal right adrenal. There is a 1.7 x
cm left adrenal mass (series 10/ image 31), which demonstrates
prominent signal loss on out of phase chemical shift imaging, and
which measured 1.7 x 1.4 cm on 10/11/2004 MRI abdomen, in keeping
with a benign left adrenal adenoma. No hydronephrosis. There are a
few simple renal cysts in both kidneys, largest 1.0 cm in the upper
right kidney. No suspicious renal masses.

Stomach/Bowel: Grossly normal stomach. Visualized small and large
bowel is normal caliber, with no bowel wall thickening.

Vascular/Lymphatic: Normal caliber abdominal aorta. Patent portal,
splenic, hepatic and renal veins. No pathologically enlarged lymph
nodes in the abdomen.

Other: No abdominal ascites or focal fluid collection.

Musculoskeletal: No aggressive appearing focal osseous lesions.
IMPRESSION: 1. Benign left adrenal adenoma, stable in size since [DATE]. Minimal diffuse hepatic steatosis. No suspicious liver lesions.
Stable benign flash filling liver hemangioma and simple
subcentimeter liver cyst.
3. Cholecystectomy.  No biliary ductal dilatation.

## 2017-04-16 IMAGING — US US BIOPSY
1 series · 4 of 4 positions shown · non-contrast
Comparison: none

INDICATION: Abnormal LFTs

[Series 1: us biopsy · 0.22mm/px · 4 of 4 slices shown]
[im 1/4]
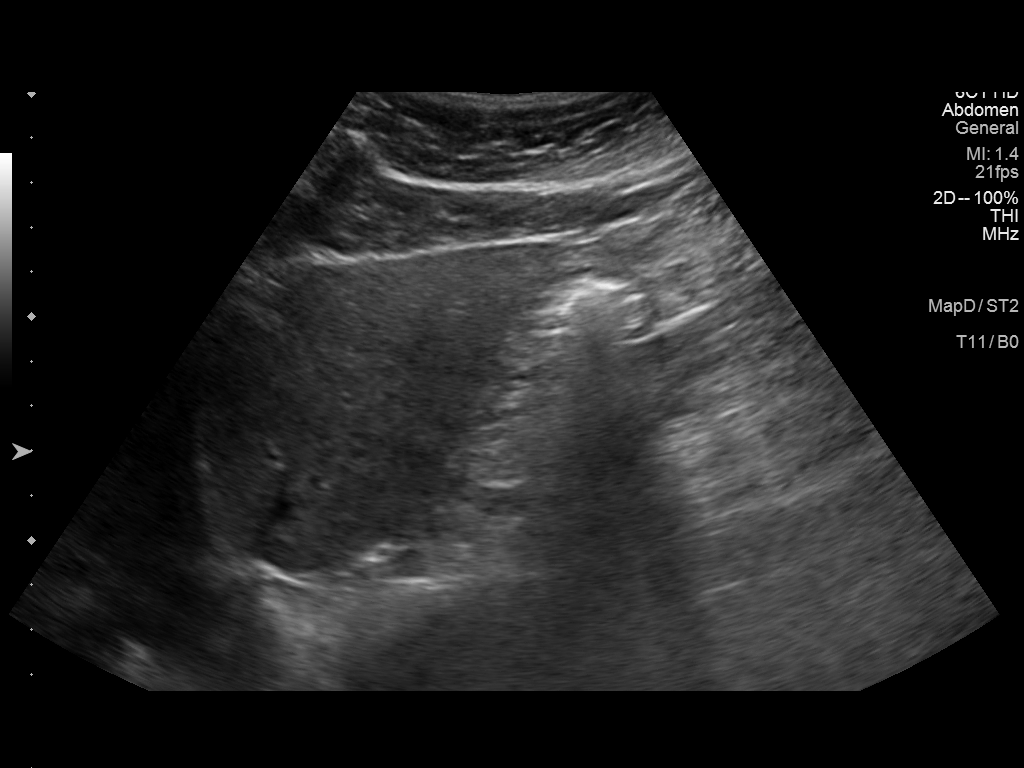
[im 2/4]
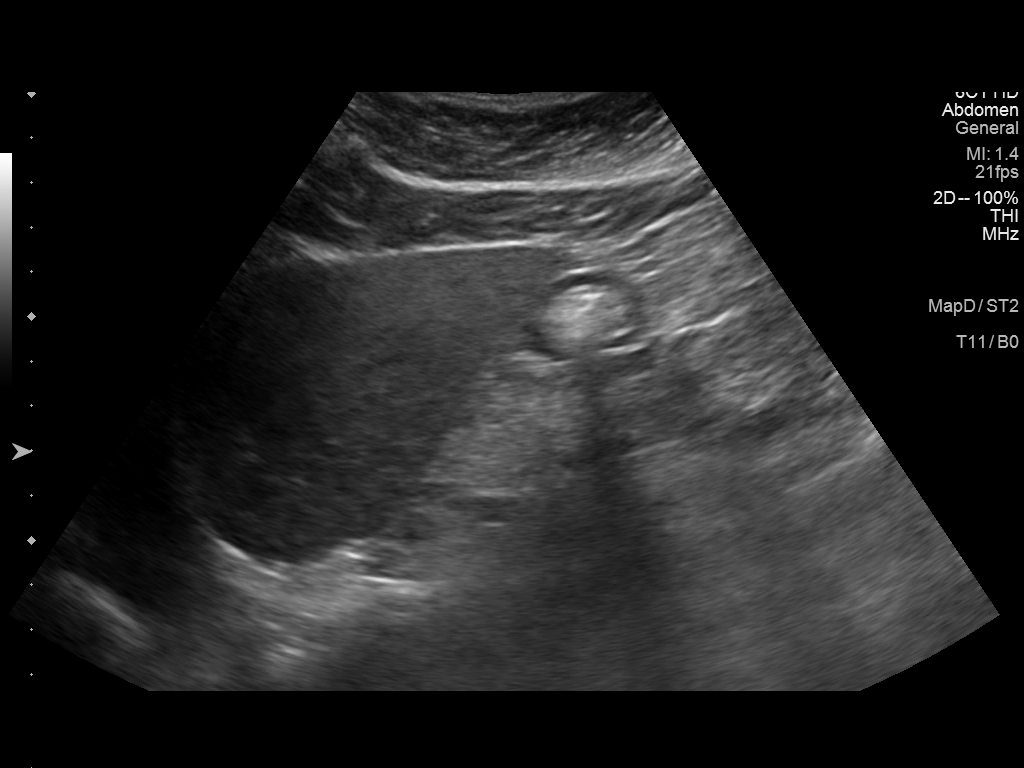
[im 3/4]
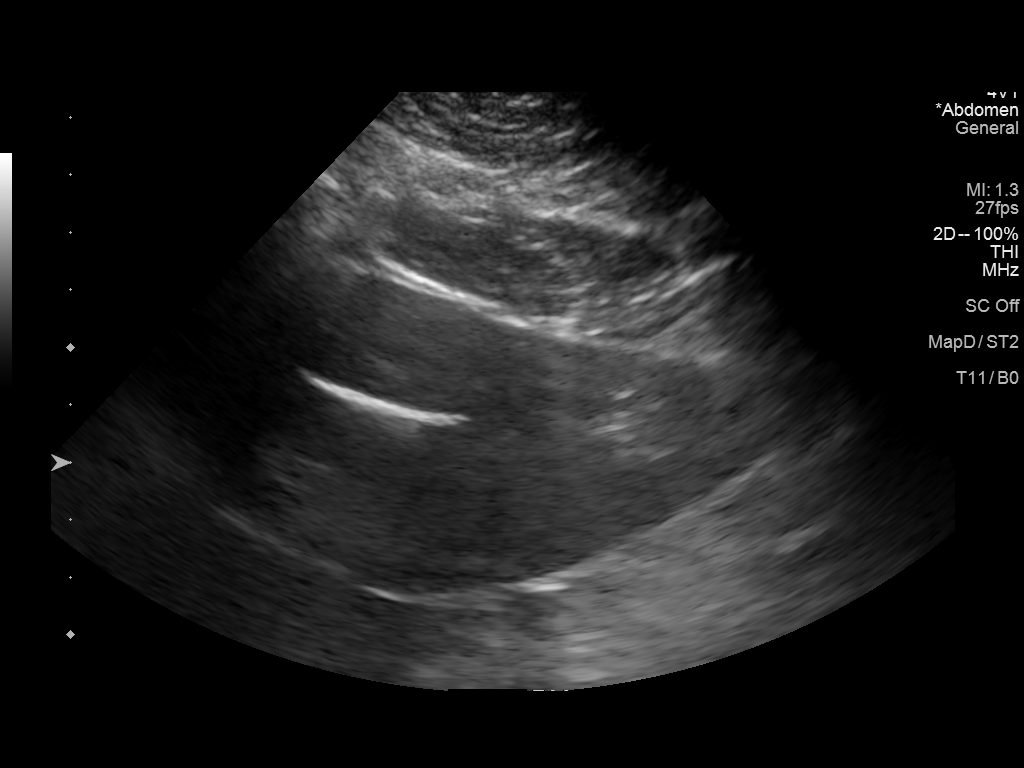
[im 4/4]
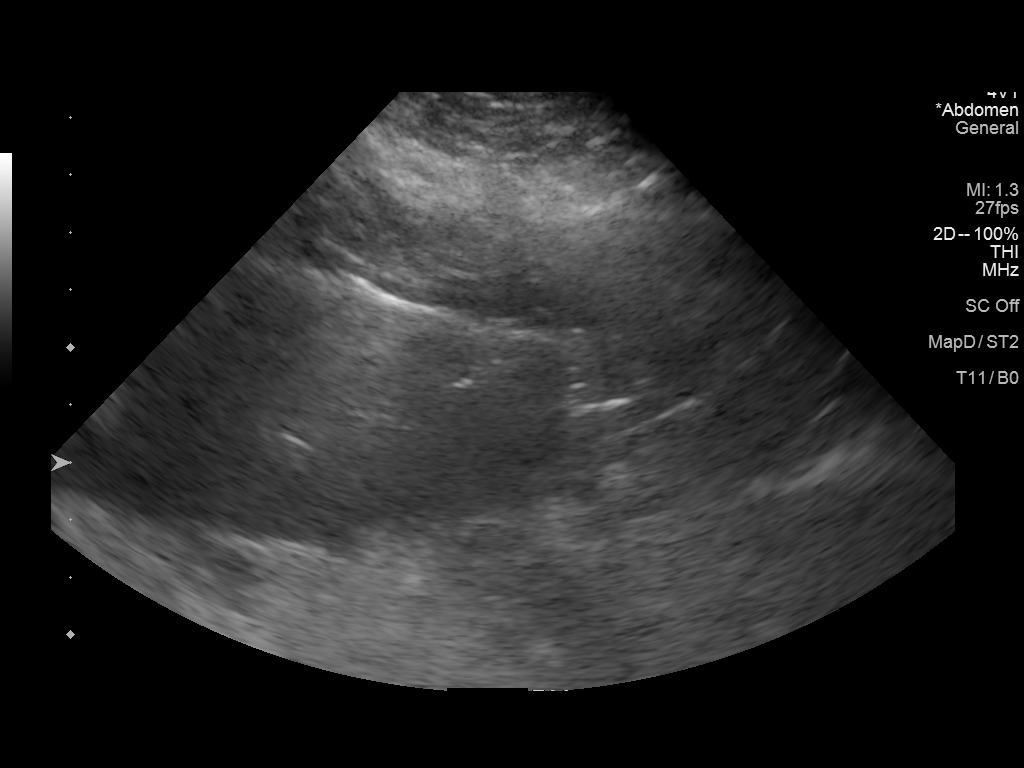

[4 of 4 positions shown; findings below may reference images not displayed]

EXAM:
ULTRASOUND BIOPSY CORE LIVER

MEDICATIONS:
None.

ANESTHESIA/SEDATION:
Moderate (conscious) sedation was employed during this procedure. A
total of Versed 1 mg and Fentanyl 100 mcg was administered
intravenously.

Moderate Sedation Time: 10 minutes. The patient's level of
consciousness and vital signs were monitored continuously by
radiology nursing throughout the procedure under my direct
supervision.

FLUOROSCOPY TIME:  Not applicable

COMPLICATIONS:
None immediate.

PROCEDURE:
Informed written consent was obtained from the patient after a
thorough discussion of the procedural risks, benefits and
alternatives. All questions were addressed. Maximal Sterile Barrier
Technique was utilized including caps, mask, sterile gowns, sterile
gloves, sterile drape, hand hygiene and skin antiseptic. A timeout
was performed prior to the initiation of the procedure.

Utilizing 1% xylocaine as local anesthetic and real-time ultrasound
guidance a 17 gauge guiding needle was placed percutaneously into
the left lobe of the liver. Multiple 18 gauge core biopsies were
then obtained. These were sent to pathology for evaluation. The
needle was then removed and Gel-Foam slurry was placed to aid in
hemostasis. The patient tolerated the procedure well and was
returned to her room in satisfactory condition.
IMPRESSION: Successful ultrasound-guided random liver biopsy for elevated LFTs.

## 2017-04-19 DIAGNOSIS — R21 Rash and other nonspecific skin eruption: Secondary | ICD-10-CM | POA: Diagnosis not present

## 2017-04-22 DIAGNOSIS — E7800 Pure hypercholesterolemia, unspecified: Secondary | ICD-10-CM | POA: Diagnosis not present

## 2017-04-22 DIAGNOSIS — R030 Elevated blood-pressure reading, without diagnosis of hypertension: Secondary | ICD-10-CM | POA: Diagnosis not present

## 2017-04-22 DIAGNOSIS — E039 Hypothyroidism, unspecified: Secondary | ICD-10-CM | POA: Diagnosis not present

## 2017-04-24 DIAGNOSIS — R350 Frequency of micturition: Secondary | ICD-10-CM | POA: Diagnosis not present

## 2017-04-24 DIAGNOSIS — R300 Dysuria: Secondary | ICD-10-CM | POA: Diagnosis not present

## 2017-04-29 DIAGNOSIS — E7800 Pure hypercholesterolemia, unspecified: Secondary | ICD-10-CM | POA: Diagnosis not present

## 2017-04-29 DIAGNOSIS — E6609 Other obesity due to excess calories: Secondary | ICD-10-CM | POA: Diagnosis not present

## 2017-04-29 DIAGNOSIS — E039 Hypothyroidism, unspecified: Secondary | ICD-10-CM | POA: Diagnosis not present

## 2017-05-15 DIAGNOSIS — H2513 Age-related nuclear cataract, bilateral: Secondary | ICD-10-CM | POA: Diagnosis not present

## 2017-07-30 DIAGNOSIS — Z0000 Encounter for general adult medical examination without abnormal findings: Secondary | ICD-10-CM | POA: Diagnosis not present

## 2017-07-30 DIAGNOSIS — L403 Pustulosis palmaris et plantaris: Secondary | ICD-10-CM | POA: Diagnosis not present

## 2017-09-15 ENCOUNTER — Other Ambulatory Visit: Payer: Self-pay | Admitting: Family Medicine

## 2017-09-15 DIAGNOSIS — Z1231 Encounter for screening mammogram for malignant neoplasm of breast: Secondary | ICD-10-CM

## 2017-09-22 ENCOUNTER — Ambulatory Visit
Admission: RE | Admit: 2017-09-22 | Discharge: 2017-09-22 | Disposition: A | Discharge status: Home / Self Care | Payer: PPO | Source: Ambulatory Visit | Attending: Family Medicine | Admitting: Family Medicine

## 2017-09-22 DIAGNOSIS — Z1231 Encounter for screening mammogram for malignant neoplasm of breast: Secondary | ICD-10-CM | POA: Diagnosis not present

## 2017-09-22 HISTORY — DX: Personal history of irradiation: Z92.3

## 2017-09-22 HISTORY — DX: Personal history of antineoplastic chemotherapy: Z92.21

## 2017-10-31 DIAGNOSIS — E7800 Pure hypercholesterolemia, unspecified: Secondary | ICD-10-CM | POA: Diagnosis not present

## 2017-10-31 DIAGNOSIS — E039 Hypothyroidism, unspecified: Secondary | ICD-10-CM | POA: Diagnosis not present

## 2017-11-05 DIAGNOSIS — E039 Hypothyroidism, unspecified: Secondary | ICD-10-CM | POA: Diagnosis not present

## 2017-11-05 DIAGNOSIS — E7800 Pure hypercholesterolemia, unspecified: Secondary | ICD-10-CM | POA: Diagnosis not present

## 2017-11-05 DIAGNOSIS — Z0000 Encounter for general adult medical examination without abnormal findings: Secondary | ICD-10-CM | POA: Diagnosis not present

## 2017-11-05 DIAGNOSIS — E6609 Other obesity due to excess calories: Secondary | ICD-10-CM | POA: Diagnosis not present

## 2018-04-28 DIAGNOSIS — E039 Hypothyroidism, unspecified: Secondary | ICD-10-CM | POA: Diagnosis not present

## 2018-04-28 DIAGNOSIS — E7800 Pure hypercholesterolemia, unspecified: Secondary | ICD-10-CM | POA: Diagnosis not present

## 2018-05-05 DIAGNOSIS — E6609 Other obesity due to excess calories: Secondary | ICD-10-CM | POA: Diagnosis not present

## 2018-05-05 DIAGNOSIS — E7800 Pure hypercholesterolemia, unspecified: Secondary | ICD-10-CM | POA: Diagnosis not present

## 2018-05-05 DIAGNOSIS — E039 Hypothyroidism, unspecified: Secondary | ICD-10-CM | POA: Diagnosis not present

## 2018-06-30 DIAGNOSIS — E039 Hypothyroidism, unspecified: Secondary | ICD-10-CM | POA: Diagnosis not present

## 2018-07-29 DIAGNOSIS — L403 Pustulosis palmaris et plantaris: Secondary | ICD-10-CM | POA: Diagnosis not present

## 2018-07-31 DIAGNOSIS — S00462A Insect bite (nonvenomous) of left ear, initial encounter: Secondary | ICD-10-CM | POA: Diagnosis not present

## 2018-07-31 DIAGNOSIS — T162XXA Foreign body in left ear, initial encounter: Secondary | ICD-10-CM | POA: Diagnosis not present

## 2018-07-31 DIAGNOSIS — H9202 Otalgia, left ear: Secondary | ICD-10-CM | POA: Diagnosis not present

## 2018-07-31 DIAGNOSIS — H60502 Unspecified acute noninfective otitis externa, left ear: Secondary | ICD-10-CM | POA: Diagnosis not present

## 2018-07-31 DIAGNOSIS — W57XXXA Bitten or stung by nonvenomous insect and other nonvenomous arthropods, initial encounter: Secondary | ICD-10-CM | POA: Diagnosis not present

## 2018-08-26 DIAGNOSIS — E039 Hypothyroidism, unspecified: Secondary | ICD-10-CM | POA: Diagnosis not present

## 2018-11-24 DIAGNOSIS — Z853 Personal history of malignant neoplasm of breast: Secondary | ICD-10-CM | POA: Diagnosis not present

## 2018-11-24 DIAGNOSIS — Z8719 Personal history of other diseases of the digestive system: Secondary | ICD-10-CM | POA: Diagnosis not present

## 2018-11-24 DIAGNOSIS — Z86010 Personal history of colonic polyps: Secondary | ICD-10-CM | POA: Diagnosis not present

## 2018-11-24 DIAGNOSIS — R1013 Epigastric pain: Secondary | ICD-10-CM | POA: Diagnosis not present

## 2018-11-24 DIAGNOSIS — R110 Nausea: Secondary | ICD-10-CM | POA: Diagnosis not present

## 2018-11-24 DIAGNOSIS — E7800 Pure hypercholesterolemia, unspecified: Secondary | ICD-10-CM | POA: Diagnosis not present

## 2018-11-24 DIAGNOSIS — E6609 Other obesity due to excess calories: Secondary | ICD-10-CM | POA: Diagnosis not present

## 2018-11-24 DIAGNOSIS — E039 Hypothyroidism, unspecified: Secondary | ICD-10-CM | POA: Diagnosis not present

## 2018-12-14 DIAGNOSIS — R05 Cough: Secondary | ICD-10-CM | POA: Diagnosis not present

## 2018-12-14 DIAGNOSIS — J0100 Acute maxillary sinusitis, unspecified: Secondary | ICD-10-CM | POA: Diagnosis not present

## 2019-02-15 DIAGNOSIS — E039 Hypothyroidism, unspecified: Secondary | ICD-10-CM | POA: Diagnosis not present

## 2019-02-15 DIAGNOSIS — E7800 Pure hypercholesterolemia, unspecified: Secondary | ICD-10-CM | POA: Diagnosis not present

## 2019-02-18 DIAGNOSIS — Z0000 Encounter for general adult medical examination without abnormal findings: Secondary | ICD-10-CM | POA: Diagnosis not present

## 2019-02-18 DIAGNOSIS — E039 Hypothyroidism, unspecified: Secondary | ICD-10-CM | POA: Diagnosis not present

## 2019-02-18 DIAGNOSIS — E6609 Other obesity due to excess calories: Secondary | ICD-10-CM | POA: Diagnosis not present

## 2019-02-18 DIAGNOSIS — E782 Mixed hyperlipidemia: Secondary | ICD-10-CM | POA: Diagnosis not present

## 2019-04-23 ENCOUNTER — Other Ambulatory Visit: Admission: RE | Admit: 2019-04-23 | Payer: PPO | Source: Ambulatory Visit

## 2019-04-28 ENCOUNTER — Ambulatory Visit: Admission: RE | Admit: 2019-04-28 | Payer: PPO | Source: Home / Self Care | Admitting: Internal Medicine

## 2019-04-28 ENCOUNTER — Encounter: Admission: RE | Payer: Self-pay | Source: Home / Self Care

## 2019-04-28 SURGERY — COLONOSCOPY WITH PROPOFOL
Anesthesia: General

## 2019-06-16 DIAGNOSIS — E039 Hypothyroidism, unspecified: Secondary | ICD-10-CM | POA: Diagnosis not present

## 2019-06-16 DIAGNOSIS — E782 Mixed hyperlipidemia: Secondary | ICD-10-CM | POA: Diagnosis not present

## 2019-06-21 DIAGNOSIS — E039 Hypothyroidism, unspecified: Secondary | ICD-10-CM | POA: Diagnosis not present

## 2019-06-21 DIAGNOSIS — E782 Mixed hyperlipidemia: Secondary | ICD-10-CM | POA: Diagnosis not present

## 2019-06-21 DIAGNOSIS — E6609 Other obesity due to excess calories: Secondary | ICD-10-CM | POA: Diagnosis not present

## 2019-07-14 DIAGNOSIS — K7581 Nonalcoholic steatohepatitis (NASH): Secondary | ICD-10-CM | POA: Diagnosis not present

## 2019-07-14 DIAGNOSIS — Z8719 Personal history of other diseases of the digestive system: Secondary | ICD-10-CM | POA: Diagnosis not present

## 2019-07-14 DIAGNOSIS — Z853 Personal history of malignant neoplasm of breast: Secondary | ICD-10-CM | POA: Diagnosis not present

## 2019-07-14 DIAGNOSIS — K760 Fatty (change of) liver, not elsewhere classified: Secondary | ICD-10-CM | POA: Diagnosis not present

## 2019-07-14 DIAGNOSIS — Z86010 Personal history of colonic polyps: Secondary | ICD-10-CM | POA: Diagnosis not present

## 2019-07-14 DIAGNOSIS — E039 Hypothyroidism, unspecified: Secondary | ICD-10-CM | POA: Diagnosis not present

## 2019-07-14 DIAGNOSIS — R748 Abnormal levels of other serum enzymes: Secondary | ICD-10-CM | POA: Diagnosis not present

## 2019-07-15 ENCOUNTER — Other Ambulatory Visit: Payer: Self-pay | Admitting: Gastroenterology

## 2019-07-15 DIAGNOSIS — K760 Fatty (change of) liver, not elsewhere classified: Secondary | ICD-10-CM

## 2019-07-15 DIAGNOSIS — R748 Abnormal levels of other serum enzymes: Secondary | ICD-10-CM

## 2019-07-28 ENCOUNTER — Other Ambulatory Visit: Payer: Self-pay

## 2019-07-28 ENCOUNTER — Ambulatory Visit
Admission: RE | Admit: 2019-07-28 | Discharge: 2019-07-28 | Disposition: A | Discharge status: Home / Self Care | Payer: PPO | Source: Ambulatory Visit | Attending: Gastroenterology | Admitting: Gastroenterology

## 2019-07-28 DIAGNOSIS — R748 Abnormal levels of other serum enzymes: Secondary | ICD-10-CM | POA: Diagnosis not present

## 2019-07-28 DIAGNOSIS — L400 Psoriasis vulgaris: Secondary | ICD-10-CM | POA: Diagnosis not present

## 2019-07-28 DIAGNOSIS — K760 Fatty (change of) liver, not elsewhere classified: Secondary | ICD-10-CM | POA: Insufficient documentation

## 2019-08-20 DIAGNOSIS — E039 Hypothyroidism, unspecified: Secondary | ICD-10-CM | POA: Diagnosis not present

## 2019-10-11 ENCOUNTER — Other Ambulatory Visit: Payer: Self-pay

## 2019-10-11 ENCOUNTER — Other Ambulatory Visit
Admission: RE | Admit: 2019-10-11 | Discharge: 2019-10-11 | Disposition: A | Discharge status: Home / Self Care | Payer: PPO | Source: Ambulatory Visit | Attending: Internal Medicine | Admitting: Internal Medicine

## 2019-10-11 DIAGNOSIS — Z01812 Encounter for preprocedural laboratory examination: Secondary | ICD-10-CM | POA: Insufficient documentation

## 2019-10-11 DIAGNOSIS — Z20828 Contact with and (suspected) exposure to other viral communicable diseases: Secondary | ICD-10-CM | POA: Insufficient documentation

## 2019-10-11 LAB — SARS CORONAVIRUS 2 (TAT 6-24 HRS): SARS Coronavirus 2: NEGATIVE

## 2019-10-13 ENCOUNTER — Encounter: Payer: Self-pay | Admitting: Internal Medicine

## 2019-10-14 ENCOUNTER — Encounter: Payer: Self-pay | Admitting: Internal Medicine

## 2019-10-14 ENCOUNTER — Encounter
Admission: RE | Disposition: A | Discharge status: Home / Self Care | Payer: Self-pay | Source: Home / Self Care | Attending: Internal Medicine

## 2019-10-14 ENCOUNTER — Ambulatory Visit
Admission: RE | Admit: 2019-10-14 | Discharge: 2019-10-14 | Disposition: A | Discharge status: Home / Self Care | Payer: PPO | Attending: Internal Medicine | Admitting: Internal Medicine

## 2019-10-14 ENCOUNTER — Ambulatory Visit: Payer: PPO | Admitting: Anesthesiology

## 2019-10-14 ENCOUNTER — Other Ambulatory Visit: Payer: Self-pay

## 2019-10-14 DIAGNOSIS — D125 Benign neoplasm of sigmoid colon: Secondary | ICD-10-CM | POA: Insufficient documentation

## 2019-10-14 DIAGNOSIS — Z79890 Hormone replacement therapy (postmenopausal): Secondary | ICD-10-CM | POA: Insufficient documentation

## 2019-10-14 DIAGNOSIS — Z79899 Other long term (current) drug therapy: Secondary | ICD-10-CM | POA: Insufficient documentation

## 2019-10-14 DIAGNOSIS — K649 Unspecified hemorrhoids: Secondary | ICD-10-CM | POA: Diagnosis not present

## 2019-10-14 DIAGNOSIS — K5730 Diverticulosis of large intestine without perforation or abscess without bleeding: Secondary | ICD-10-CM | POA: Diagnosis not present

## 2019-10-14 DIAGNOSIS — Z923 Personal history of irradiation: Secondary | ICD-10-CM | POA: Diagnosis not present

## 2019-10-14 DIAGNOSIS — D123 Benign neoplasm of transverse colon: Secondary | ICD-10-CM | POA: Insufficient documentation

## 2019-10-14 DIAGNOSIS — E039 Hypothyroidism, unspecified: Secondary | ICD-10-CM | POA: Insufficient documentation

## 2019-10-14 DIAGNOSIS — D122 Benign neoplasm of ascending colon: Secondary | ICD-10-CM | POA: Diagnosis not present

## 2019-10-14 DIAGNOSIS — K640 First degree hemorrhoids: Secondary | ICD-10-CM | POA: Insufficient documentation

## 2019-10-14 DIAGNOSIS — Z853 Personal history of malignant neoplasm of breast: Secondary | ICD-10-CM | POA: Insufficient documentation

## 2019-10-14 DIAGNOSIS — K5790 Diverticulosis of intestine, part unspecified, without perforation or abscess without bleeding: Secondary | ICD-10-CM | POA: Diagnosis not present

## 2019-10-14 DIAGNOSIS — Z1211 Encounter for screening for malignant neoplasm of colon: Secondary | ICD-10-CM | POA: Insufficient documentation

## 2019-10-14 DIAGNOSIS — Z87891 Personal history of nicotine dependence: Secondary | ICD-10-CM | POA: Diagnosis not present

## 2019-10-14 DIAGNOSIS — E785 Hyperlipidemia, unspecified: Secondary | ICD-10-CM | POA: Diagnosis not present

## 2019-10-14 DIAGNOSIS — K219 Gastro-esophageal reflux disease without esophagitis: Secondary | ICD-10-CM | POA: Diagnosis not present

## 2019-10-14 DIAGNOSIS — K635 Polyp of colon: Secondary | ICD-10-CM | POA: Diagnosis not present

## 2019-10-14 DIAGNOSIS — Z9221 Personal history of antineoplastic chemotherapy: Secondary | ICD-10-CM | POA: Diagnosis not present

## 2019-10-14 DIAGNOSIS — Z86010 Personal history of colonic polyps: Secondary | ICD-10-CM | POA: Diagnosis not present

## 2019-10-14 HISTORY — PX: COLONOSCOPY WITH PROPOFOL: SHX5780

## 2019-10-14 SURGERY — COLONOSCOPY WITH PROPOFOL
Anesthesia: General

## 2019-10-14 MED ORDER — LIDOCAINE HCL (CARDIAC) PF 100 MG/5ML IV SOSY
PREFILLED_SYRINGE | INTRAVENOUS | Status: DC | PRN
  Administered 2019-10-14: 80 mg via INTRAVENOUS

## 2019-10-14 MED ORDER — PROPOFOL 10 MG/ML IV BOLUS
INTRAVENOUS | Status: AC
  Filled 2019-10-14: qty 80

## 2019-10-14 MED ORDER — SODIUM CHLORIDE 0.9 % IV SOLN: INTRAVENOUS | Status: DC

## 2019-10-14 MED ORDER — PROPOFOL 10 MG/ML IV BOLUS
INTRAVENOUS | Status: DC | PRN
  Administered 2019-10-14: 40 mg via INTRAVENOUS
  Administered 2019-10-14: 30 mg via INTRAVENOUS
  Administered 2019-10-14: 60 mg via INTRAVENOUS

## 2019-10-14 MED ORDER — PROPOFOL 500 MG/50ML IV EMUL
INTRAVENOUS | Status: DC | PRN
  Administered 2019-10-14: 100 ug/kg/min via INTRAVENOUS

## 2019-10-14 NOTE — Interval H&P Note (Signed)
History and Physical Interval Note:  10/14/2019 8:41 AM  Faith Garrett  has presented today for surgery, with the diagnosis of hx of polyps.  The various methods of treatment have been discussed with the patient and family. After consideration of risks, benefits and other options for treatment, the patient has consented to  Procedure(s): COLONOSCOPY WITH PROPOFOL (N/A) as a surgical intervention.  The patient's history has been reviewed, patient examined, no change in status, stable for surgery.  I have reviewed the patient's chart and labs.  Questions were answered to the patient's satisfaction.     [place], [place]

## 2019-10-14 NOTE — H&P (Signed)
Outpatient short stay form Pre-procedure 10/14/2019 8:35 AM Faith Garrett K. Alice Reichert, M.D.  Primary Physician: Dion Body, M.D.  Reason for visit:  Personal hx of colon polyps (adenoma in 2005).  History of present illness:                            Patient presents for colonoscopy for a personal hx of colon polyps. The patient denies abdominal pain, abnormal weight loss or rectal bleeding.    No current facility-administered medications for this encounter.  Medications Prior to Admission  Medication Sig Dispense Refill Last Dose  . carboxymethylcellulose (REFRESH TEARS) 0.5 % SOLN 2 drops 3 (three) times daily as needed (for DRY EYES).     Marland Kitchen ezetimibe (ZETIA) 10 MG tablet Take 10 mg by mouth daily.     Marland Kitchen levothyroxine (SYNTHROID) 112 MCG tablet Take 112 mcg by mouth daily before breakfast.     . pantoprazole (PROTONIX) 40 MG tablet Take 40 mg by mouth daily.     . Ascorbic Acid (VITAMIN C) 1000 MG tablet Take 2,000 mg by mouth daily.     . cholecalciferol (VITAMIN D) 400 units TABS tablet Take 4,000 Units by mouth.     . clobetasol cream (TEMOVATE) [id number] % Apply 1 application topically as needed.     . diphenhydrAMINE (SOMINEX) 25 MG tablet Take 25 mg by mouth at bedtime as needed for sleep. Reported on 01/24/2016     . fexofenadine-pseudoephedrine (ALLEGRA-D) 60-120 MG per tablet Take 1 tablet by mouth as needed.     . fluticasone (FLONASE) 50 MCG/ACT nasal spray Place 2 sprays into both nostrils daily.     Marland Kitchen lovastatin (MEVACOR) 20 MG tablet Take 20 mg by mouth daily. AM     . Potassium 99 MG TABS Take by mouth. AM     . pyridOXINE (VITAMIN B-6) 50 MG tablet Take 50 mg by mouth daily.     . sucralfate (CARAFATE) 1 g tablet Take 1 tablet (1 g total) by mouth 4 (four) times daily. 120 tablet 1      Allergies  Allergen Reactions  . Other Anaphylaxis    zomax  . Codeine Sulfate Itching     Past Medical History:  Diagnosis Date  . Breast cancer ([place]) 2001   LT LUMPECTOMY  .  Gastric ulcer    H/O  . GERD (gastroesophageal reflux disease)   . Hyperlipidemia   . Hypothyroidism   . Personal history of chemotherapy   . Personal history of radiation therapy   . Radiation 2001   LT BREAST  . S/P chemotherapy, time since greater than 12 weeks 2001   BREAST CA    Review of systems:  Otherwise negative.    Physical Exam  Gen: Alert, oriented. Appears stated age.  HEENT: [place]/AT. PERRLA. Lungs: CTA, no wheezes. CV: RR nl S1, S2. Abd: soft, benign, no masses. BS+ Ext: No edema. Pulses 2+    Planned procedures: Proceed with colonoscopy. The patient understands the nature of the planned procedure, indications, risks, alternatives and potential complications including but not limited to bleeding, infection, perforation, damage to internal organs and possible oversedation/side effects from anesthesia. The patient agrees and gives consent to proceed.  Please refer to procedure notes for findings, recommendations and patient disposition/instructions.     Dmetrius Ambs K. Alice Reichert, M.D. Gastroenterology 10/14/2019  8:35 AM

## 2019-10-14 NOTE — Anesthesia Post-op Follow-up Note (Signed)
Anesthesia QCDR form completed.        

## 2019-10-14 NOTE — Transfer of Care (Signed)
Immediate Anesthesia Transfer of Care Note  Patient: Faith Garrett  Procedure(s) Performed: COLONOSCOPY WITH PROPOFOL (N/A )  Patient Location: PACU and Endoscopy Unit  Anesthesia Type:General  Level of Consciousness: awake, drowsy and patient cooperative  Airway & Oxygen Therapy: Patient Spontanous Breathing  Post-op Assessment: Report given to RN and Post -op Vital signs reviewed and stable  Post vital signs: Reviewed and stable  Last Vitals:  Vitals Value Taken Time  BP 127/71 10/14/19 1010  Temp 36.2 C 10/14/19 1009  Pulse 75 10/14/19 1011  Resp 23 10/14/19 1011  SpO2 98 % 10/14/19 1011  Vitals shown include unvalidated device data.  Last Pain:  Vitals:   10/14/19 1009  TempSrc: Temporal  PainSc: Asleep         Complications: No apparent anesthesia complications

## 2019-10-14 NOTE — Anesthesia Preprocedure Evaluation (Signed)
Anesthesia Evaluation  Patient identified by MRN, date of birth, ID band Patient awake    Reviewed: Allergy & Precautions, H&P , NPO status , Patient's Chart, lab work & pertinent test results, reviewed documented beta blocker date and time   Airway Mallampati: II   Neck ROM: full    Dental  (+) Poor Dentition   Pulmonary neg pulmonary ROS, former smoker,    Pulmonary exam normal        Cardiovascular Exercise Tolerance: Good negative cardio ROS Normal cardiovascular exam Rhythm:regular Rate:Normal     Neuro/Psych negative neurological ROS  negative psych ROS   GI/Hepatic Neg liver ROS, PUD, GERD  Medicated,  Endo/Other  Hypothyroidism   Renal/GU negative Renal ROS  negative genitourinary   Musculoskeletal   Abdominal   Peds  Hematology negative hematology ROS (+)   Anesthesia Other Findings Past Medical History: 2001: Breast cancer ([place])     Comment:  LT LUMPECTOMY No date: Gastric ulcer     Comment:  H/O No date: GERD (gastroesophageal reflux disease) No date: Hyperlipidemia No date: Hypothyroidism No date: Personal history of chemotherapy No date: Personal history of radiation therapy 2001: Radiation     Comment:  LT BREAST 2001: S/P chemotherapy, time since greater than 12 weeks     Comment:  BREAST CA Past Surgical History: No date: BREAST LUMPECTOMY; Left No date: CARPAL TUNNEL RELEASE; Bilateral No date: CHOLECYSTECTOMY No date: COLONOSCOPY No date: ESOPHAGOGASTRODUODENOSCOPY 01/11/2016: ESOPHAGOGASTRODUODENOSCOPY (EGD) WITH PROPOFOL; N/A     Comment:  Procedure: ESOPHAGOGASTRODUODENOSCOPY (EGD) WITH               PROPOFOL;  Surgeon: Hulen Luster, MD;  Location: [facility]               ENDOSCOPY;  Service: Gastroenterology;  Laterality: N/A; No date: HEMORRHOID SURGERY 05/26/2015: SHOULDER ARTHROSCOPY WITH OPEN ROTATOR CUFF REPAIR; Right     Comment:  Procedure: SHOULDER ARTHROSCOPY WITH OPEN ROTATOR  CUFF               REPAIR;  Surgeon: Leanor Kail, MD;  Location: [place];  Service: Orthopedics;  Laterality: Right;               subacromial decompression No date: WRIST FRACTURE SURGERY; Left BMI    Body Mass Index: 33.89 kg/m     Reproductive/Obstetrics negative OB ROS                             Anesthesia Physical Anesthesia Plan  ASA: III  Anesthesia Plan: General   Post-op Pain Management:    Induction:   PONV Risk Score and Plan:   Airway Management Planned:   Additional Equipment:   Intra-op Plan:   Post-operative Plan:   Informed Consent: I have reviewed the patients History and Physical, chart, labs and discussed the procedure including the risks, benefits and alternatives for the proposed anesthesia with the patient or authorized representative who has indicated his/her understanding and acceptance.     Dental Advisory Given  Plan Discussed with: CRNA  Anesthesia Plan Comments:         Anesthesia Quick Evaluation

## 2019-10-14 NOTE — Anesthesia Postprocedure Evaluation (Signed)
Anesthesia Post Note  Patient: Catheryn Bacon  Procedure(s) Performed: COLONOSCOPY WITH PROPOFOL (N/A )  Patient location during evaluation: PACU Anesthesia Type: General Level of consciousness: awake and alert Pain management: pain level controlled Vital Signs Assessment: post-procedure vital signs reviewed and stable Respiratory status: spontaneous breathing, nonlabored ventilation, respiratory function stable and patient connected to nasal cannula oxygen Cardiovascular status: blood pressure returned to baseline and stable Postop Assessment: no apparent nausea or vomiting Anesthetic complications: no     Last Vitals:  Vitals:   10/14/19 1029 10/14/19 1037  BP: (!) 157/76 (!) 145/88  Pulse: 68 69  Resp: 17 16  Temp:    SpO2: 100% 100%    Last Pain:  Vitals:   10/14/19 1037  TempSrc:   PainSc: 0-No pain                 Molli Barrows

## 2019-10-14 NOTE — Op Note (Signed)
[facility] Gastroenterology Patient Name: Faith Garrett Procedure Date: 10/14/2019 9:15 AM MRN: [id number] Account #: [id number] Date of Birth: April 06, 1946 Admit Type: Outpatient Age: 73 Room: [facility] ENDO ROOM 1 Gender: Female Note Status: Finalized Procedure:             Colonoscopy Indications:           Surveillance: Personal history of adenomatous polyps                         on last colonoscopy > 5 years ago Providers:             Lorie Apley K. Alice Reichert MD, MD Referring MD:          Dion Body (Referring MD) Medicines:             Propofol per Anesthesia Complications:         No immediate complications. Estimated blood loss:                         Minimal. Procedure:             Pre-Anesthesia Assessment:                        - The risks and benefits of the procedure and the                         sedation options and risks were discussed with the                         patient. All questions were answered and informed                         consent was obtained.                        - Patient identification and proposed procedure were                         verified prior to the procedure by the nurse. The                         procedure was verified in the procedure room.                        - ASA Grade Assessment: III - A patient with severe                         systemic disease.                        - After reviewing the risks and benefits, the patient                         was deemed in satisfactory condition to undergo the                         procedure.                        After obtaining informed consent, the colonoscope was  passed under direct vision. Throughout the procedure,                         the patient's blood pressure, pulse, and oxygen                         saturations were monitored continuously. The                         Colonoscope was introduced through the anus and                advanced to the the cecum, identified by appendiceal                         orifice and ileocecal valve. The colonoscopy was                         performed without difficulty. The patient tolerated                         the procedure well. The quality of the bowel                         preparation was good. The ileocecal valve, appendiceal                         orifice, and rectum were photographed. Findings:      The perianal and digital rectal examinations were normal. Pertinent       negatives include normal sphincter tone and no palpable rectal lesions.      A few medium-mouthed diverticula were found in the sigmoid colon.      Two pedunculated polyps were found in the hepatic flexure. The polyps       were 4 to 5 mm in size. These polyps were removed with a cold snare.       Resection and retrieval were complete.      A 4 mm polyp was found in the ascending colon. The polyp was sessile.       The polyp was removed with a jumbo cold forceps. Resection and retrieval       were complete.      Two semi-pedunculated polyps were found in the mid transverse colon. The       polyps were 4 to 6 mm in size. These polyps were removed with a cold       snare. Resection and retrieval were complete.      A 6 mm polyp was found in the proximal sigmoid colon. The polyp was       pedunculated. The polyp was removed with a cold snare. Resection and       retrieval were complete.      A 4 mm polyp was found in the distal sigmoid colon. The polyp was       sessile. The polyp was removed with a jumbo cold forceps. Resection and       retrieval were complete.      Non-bleeding internal hemorrhoids were found during retroflexion. The       hemorrhoids were Grade I (internal hemorrhoids that do not prolapse).      The exam was otherwise without abnormality. Impression:            -  Diverticulosis in the sigmoid colon.                        - Two 4 to 5 mm polyps at the hepatic  flexure, removed                         with a cold snare. Resected and retrieved.                        - One 4 mm polyp in the ascending colon, removed with                         a jumbo cold forceps. Resected and retrieved.                        - Two 4 to 6 mm polyps in the mid transverse colon,                         removed with a cold snare. Resected and retrieved.                        - One 6 mm polyp in the proximal sigmoid colon,                         removed with a cold snare. Resected and retrieved.                        - One 4 mm polyp in the distal sigmoid colon, removed                         with a jumbo cold forceps. Resected and retrieved.                        - Non-bleeding internal hemorrhoids.                        - The examination was otherwise normal. Recommendation:        - Patient has a contact number available for                         emergencies. The signs and symptoms of potential                         delayed complications were discussed with the patient.                         Return to normal activities tomorrow. Written                         discharge instructions were provided to the patient.                        - Resume previous diet.                        - Continue present medications.                        -  Repeat colonoscopy is recommended for surveillance.                         The colonoscopy date will be determined after                         pathology results from today's exam become available                         for review.                        - Return to GI office PRN.                        - The findings and recommendations were discussed with                         the patient. Procedure Code(s):     --- Professional ---                        [phone], Colonoscopy, flexible; with removal of                         tumor(s), polyp(s), or other lesion(s) by snare                         technique                         45380, 61, Colonoscopy, flexible; with biopsy, single                         or multiple Diagnosis Code(s):     --- Professional ---                        K57.30, Diverticulosis of large intestine without                         perforation or abscess without bleeding                        K64.0, First degree hemorrhoids                        Z86.010, Personal history of colonic polyps                        K63.5, Polyp of colon CPT copyright 2019 American [employer] Association. All rights reserved. The codes documented in this report are preliminary and upon coder review may  be revised to meet current compliance requirements. Efrain Sella MD, MD 10/14/2019 10:13:02 AM This report has been signed electronically. Number of Addenda: 0 Note Initiated On: 10/14/2019 9:15 AM Scope Withdrawal Time: 0 hours 16 minutes 22 seconds  Total Procedure Duration: 0 hours 29 minutes 1 second  Estimated Blood Loss:  Estimated blood loss was minimal.      [facility]

## 2019-10-15 ENCOUNTER — Encounter: Payer: Self-pay | Admitting: *Deleted

## 2019-10-15 LAB — SURGICAL PATHOLOGY

## 2019-10-18 DIAGNOSIS — E039 Hypothyroidism, unspecified: Secondary | ICD-10-CM | POA: Diagnosis not present

## 2019-10-18 DIAGNOSIS — H2513 Age-related nuclear cataract, bilateral: Secondary | ICD-10-CM | POA: Diagnosis not present

## 2019-12-21 DIAGNOSIS — E039 Hypothyroidism, unspecified: Secondary | ICD-10-CM | POA: Diagnosis not present

## 2019-12-21 DIAGNOSIS — E782 Mixed hyperlipidemia: Secondary | ICD-10-CM | POA: Diagnosis not present

## 2019-12-27 DIAGNOSIS — E6609 Other obesity due to excess calories: Secondary | ICD-10-CM | POA: Diagnosis not present

## 2019-12-27 DIAGNOSIS — R030 Elevated blood-pressure reading, without diagnosis of hypertension: Secondary | ICD-10-CM | POA: Diagnosis not present

## 2019-12-27 DIAGNOSIS — E782 Mixed hyperlipidemia: Secondary | ICD-10-CM | POA: Diagnosis not present

## 2019-12-27 DIAGNOSIS — R079 Chest pain, unspecified: Secondary | ICD-10-CM | POA: Diagnosis not present

## 2019-12-27 DIAGNOSIS — E039 Hypothyroidism, unspecified: Secondary | ICD-10-CM | POA: Diagnosis not present

## 2020-03-07 DIAGNOSIS — Z780 Asymptomatic menopausal state: Secondary | ICD-10-CM | POA: Diagnosis not present

## 2020-04-04 DIAGNOSIS — M810 Age-related osteoporosis without current pathological fracture: Secondary | ICD-10-CM | POA: Diagnosis not present

## 2020-04-27 DIAGNOSIS — E6609 Other obesity due to excess calories: Secondary | ICD-10-CM | POA: Diagnosis not present

## 2020-04-27 DIAGNOSIS — E782 Mixed hyperlipidemia: Secondary | ICD-10-CM | POA: Diagnosis not present

## 2020-04-27 DIAGNOSIS — E039 Hypothyroidism, unspecified: Secondary | ICD-10-CM | POA: Diagnosis not present

## 2020-05-04 DIAGNOSIS — Z0000 Encounter for general adult medical examination without abnormal findings: Secondary | ICD-10-CM | POA: Diagnosis not present

## 2020-05-04 DIAGNOSIS — R030 Elevated blood-pressure reading, without diagnosis of hypertension: Secondary | ICD-10-CM | POA: Diagnosis not present

## 2020-05-04 DIAGNOSIS — R7401 Elevation of levels of liver transaminase levels: Secondary | ICD-10-CM | POA: Diagnosis not present

## 2020-05-04 DIAGNOSIS — E782 Mixed hyperlipidemia: Secondary | ICD-10-CM | POA: Diagnosis not present

## 2020-05-04 DIAGNOSIS — E6609 Other obesity due to excess calories: Secondary | ICD-10-CM | POA: Diagnosis not present

## 2020-05-04 DIAGNOSIS — E039 Hypothyroidism, unspecified: Secondary | ICD-10-CM | POA: Diagnosis not present

## 2020-05-08 DIAGNOSIS — Z9889 Other specified postprocedural states: Secondary | ICD-10-CM | POA: Diagnosis not present

## 2020-05-08 DIAGNOSIS — E210 Primary hyperparathyroidism: Secondary | ICD-10-CM | POA: Diagnosis not present

## 2020-05-16 DIAGNOSIS — Z923 Personal history of irradiation: Secondary | ICD-10-CM | POA: Diagnosis not present

## 2020-05-16 DIAGNOSIS — Z853 Personal history of malignant neoplasm of breast: Secondary | ICD-10-CM | POA: Diagnosis not present

## 2020-05-16 DIAGNOSIS — Z79890 Hormone replacement therapy (postmenopausal): Secondary | ICD-10-CM | POA: Diagnosis not present

## 2020-05-16 DIAGNOSIS — Z79899 Other long term (current) drug therapy: Secondary | ICD-10-CM | POA: Diagnosis not present

## 2020-05-16 DIAGNOSIS — Z9049 Acquired absence of other specified parts of digestive tract: Secondary | ICD-10-CM | POA: Diagnosis not present

## 2020-05-16 DIAGNOSIS — Z885 Allergy status to narcotic agent status: Secondary | ICD-10-CM | POA: Diagnosis not present

## 2020-05-16 DIAGNOSIS — E785 Hyperlipidemia, unspecified: Secondary | ICD-10-CM | POA: Diagnosis not present

## 2020-05-16 DIAGNOSIS — E034 Atrophy of thyroid (acquired): Secondary | ICD-10-CM | POA: Diagnosis not present

## 2020-05-16 DIAGNOSIS — Z87891 Personal history of nicotine dependence: Secondary | ICD-10-CM | POA: Diagnosis not present

## 2020-05-16 DIAGNOSIS — E210 Primary hyperparathyroidism: Secondary | ICD-10-CM | POA: Diagnosis not present

## 2020-05-16 DIAGNOSIS — M810 Age-related osteoporosis without current pathological fracture: Secondary | ICD-10-CM | POA: Diagnosis not present

## 2020-05-16 DIAGNOSIS — Z9221 Personal history of antineoplastic chemotherapy: Secondary | ICD-10-CM | POA: Diagnosis not present

## 2020-05-24 DIAGNOSIS — E210 Primary hyperparathyroidism: Secondary | ICD-10-CM | POA: Diagnosis not present

## 2020-05-31 DIAGNOSIS — K229 Disease of esophagus, unspecified: Secondary | ICD-10-CM | POA: Diagnosis not present

## 2020-05-31 DIAGNOSIS — E210 Primary hyperparathyroidism: Secondary | ICD-10-CM | POA: Diagnosis not present

## 2020-05-31 DIAGNOSIS — E034 Atrophy of thyroid (acquired): Secondary | ICD-10-CM | POA: Diagnosis not present

## 2020-05-31 DIAGNOSIS — E214 Other specified disorders of parathyroid gland: Secondary | ICD-10-CM | POA: Diagnosis not present

## 2020-06-07 DIAGNOSIS — Z01812 Encounter for preprocedural laboratory examination: Secondary | ICD-10-CM | POA: Diagnosis not present

## 2020-06-07 DIAGNOSIS — Z20822 Contact with and (suspected) exposure to covid-19: Secondary | ICD-10-CM | POA: Diagnosis not present

## 2020-06-09 DIAGNOSIS — E785 Hyperlipidemia, unspecified: Secondary | ICD-10-CM | POA: Diagnosis not present

## 2020-06-09 DIAGNOSIS — Z9221 Personal history of antineoplastic chemotherapy: Secondary | ICD-10-CM | POA: Diagnosis not present

## 2020-06-09 DIAGNOSIS — D351 Benign neoplasm of parathyroid gland: Secondary | ICD-10-CM | POA: Diagnosis not present

## 2020-06-09 DIAGNOSIS — Z923 Personal history of irradiation: Secondary | ICD-10-CM | POA: Diagnosis not present

## 2020-06-09 DIAGNOSIS — R5383 Other fatigue: Secondary | ICD-10-CM | POA: Diagnosis not present

## 2020-06-09 DIAGNOSIS — Z79899 Other long term (current) drug therapy: Secondary | ICD-10-CM | POA: Diagnosis not present

## 2020-06-09 DIAGNOSIS — Z79890 Hormone replacement therapy (postmenopausal): Secondary | ICD-10-CM | POA: Diagnosis not present

## 2020-06-09 DIAGNOSIS — M810 Age-related osteoporosis without current pathological fracture: Secondary | ICD-10-CM | POA: Diagnosis not present

## 2020-06-09 DIAGNOSIS — Z87891 Personal history of nicotine dependence: Secondary | ICD-10-CM | POA: Diagnosis not present

## 2020-06-09 DIAGNOSIS — K769 Liver disease, unspecified: Secondary | ICD-10-CM | POA: Diagnosis not present

## 2020-06-09 DIAGNOSIS — E213 Hyperparathyroidism, unspecified: Secondary | ICD-10-CM | POA: Diagnosis not present

## 2020-06-09 DIAGNOSIS — E669 Obesity, unspecified: Secondary | ICD-10-CM | POA: Diagnosis not present

## 2020-06-09 DIAGNOSIS — Z885 Allergy status to narcotic agent status: Secondary | ICD-10-CM | POA: Diagnosis not present

## 2020-06-09 DIAGNOSIS — E210 Primary hyperparathyroidism: Secondary | ICD-10-CM | POA: Diagnosis not present

## 2020-06-09 DIAGNOSIS — Z853 Personal history of malignant neoplasm of breast: Secondary | ICD-10-CM | POA: Diagnosis not present

## 2020-06-09 DIAGNOSIS — K279 Peptic ulcer, site unspecified, unspecified as acute or chronic, without hemorrhage or perforation: Secondary | ICD-10-CM | POA: Diagnosis not present

## 2020-06-09 DIAGNOSIS — Z6836 Body mass index (BMI) 36.0-36.9, adult: Secondary | ICD-10-CM | POA: Diagnosis not present

## 2020-06-09 DIAGNOSIS — E039 Hypothyroidism, unspecified: Secondary | ICD-10-CM | POA: Diagnosis not present

## 2020-06-27 DIAGNOSIS — Z09 Encounter for follow-up examination after completed treatment for conditions other than malignant neoplasm: Secondary | ICD-10-CM | POA: Diagnosis not present

## 2020-06-27 DIAGNOSIS — E210 Primary hyperparathyroidism: Secondary | ICD-10-CM | POA: Diagnosis not present

## 2020-07-13 ENCOUNTER — Other Ambulatory Visit: Payer: Self-pay | Admitting: Gastroenterology

## 2020-07-13 ENCOUNTER — Other Ambulatory Visit (HOSPITAL_COMMUNITY): Payer: Self-pay | Admitting: Gastroenterology

## 2020-07-13 DIAGNOSIS — R748 Abnormal levels of other serum enzymes: Secondary | ICD-10-CM

## 2020-07-13 DIAGNOSIS — R7989 Other specified abnormal findings of blood chemistry: Secondary | ICD-10-CM

## 2020-07-13 DIAGNOSIS — K219 Gastro-esophageal reflux disease without esophagitis: Secondary | ICD-10-CM | POA: Diagnosis not present

## 2020-07-13 DIAGNOSIS — K7581 Nonalcoholic steatohepatitis (NASH): Secondary | ICD-10-CM | POA: Diagnosis not present

## 2020-07-13 DIAGNOSIS — E6609 Other obesity due to excess calories: Secondary | ICD-10-CM | POA: Diagnosis not present

## 2020-07-13 DIAGNOSIS — Z86010 Personal history of colonic polyps: Secondary | ICD-10-CM | POA: Diagnosis not present

## 2020-07-13 DIAGNOSIS — R7401 Elevation of levels of liver transaminase levels: Secondary | ICD-10-CM | POA: Diagnosis not present

## 2020-07-13 DIAGNOSIS — E039 Hypothyroidism, unspecified: Secondary | ICD-10-CM | POA: Diagnosis not present

## 2020-07-13 DIAGNOSIS — Z66 Do not resuscitate: Secondary | ICD-10-CM | POA: Diagnosis not present

## 2020-07-13 DIAGNOSIS — E785 Hyperlipidemia, unspecified: Secondary | ICD-10-CM | POA: Diagnosis not present

## 2020-07-13 DIAGNOSIS — Z8711 Personal history of peptic ulcer disease: Secondary | ICD-10-CM | POA: Diagnosis not present

## 2020-07-31 DIAGNOSIS — Z8781 Personal history of (healed) traumatic fracture: Secondary | ICD-10-CM | POA: Diagnosis not present

## 2020-07-31 DIAGNOSIS — Z9889 Other specified postprocedural states: Secondary | ICD-10-CM | POA: Diagnosis not present

## 2020-07-31 DIAGNOSIS — M810 Age-related osteoporosis without current pathological fracture: Secondary | ICD-10-CM | POA: Diagnosis not present

## 2020-07-31 DIAGNOSIS — E213 Hyperparathyroidism, unspecified: Secondary | ICD-10-CM | POA: Diagnosis not present

## 2020-08-02 ENCOUNTER — Other Ambulatory Visit: Payer: Self-pay | Admitting: Gastroenterology

## 2020-08-02 ENCOUNTER — Other Ambulatory Visit: Payer: Self-pay

## 2020-08-02 ENCOUNTER — Ambulatory Visit
Admission: RE | Admit: 2020-08-02 | Discharge: 2020-08-02 | Disposition: A | Discharge status: Home / Self Care | Payer: PPO | Source: Ambulatory Visit | Attending: Gastroenterology | Admitting: Gastroenterology

## 2020-08-02 DIAGNOSIS — R7401 Elevation of levels of liver transaminase levels: Secondary | ICD-10-CM | POA: Insufficient documentation

## 2020-08-02 DIAGNOSIS — R748 Abnormal levels of other serum enzymes: Secondary | ICD-10-CM

## 2020-08-02 DIAGNOSIS — K7581 Nonalcoholic steatohepatitis (NASH): Secondary | ICD-10-CM | POA: Diagnosis not present

## 2020-08-02 DIAGNOSIS — R7989 Other specified abnormal findings of blood chemistry: Secondary | ICD-10-CM | POA: Diagnosis not present

## 2020-08-02 DIAGNOSIS — K760 Fatty (change of) liver, not elsewhere classified: Secondary | ICD-10-CM | POA: Diagnosis not present

## 2020-08-02 MED ORDER — GADOBUTROL 1 MMOL/ML IV SOLN
10.0000 mL | Freq: Once | INTRAVENOUS | Status: AC | PRN
  Administered 2020-08-02: 10 mL via INTRAVENOUS

## 2020-08-31 DIAGNOSIS — E039 Hypothyroidism, unspecified: Secondary | ICD-10-CM | POA: Diagnosis not present

## 2020-08-31 DIAGNOSIS — E782 Mixed hyperlipidemia: Secondary | ICD-10-CM | POA: Diagnosis not present

## 2020-09-07 DIAGNOSIS — E782 Mixed hyperlipidemia: Secondary | ICD-10-CM | POA: Diagnosis not present

## 2020-09-07 DIAGNOSIS — E6609 Other obesity due to excess calories: Secondary | ICD-10-CM | POA: Diagnosis not present

## 2020-09-07 DIAGNOSIS — E039 Hypothyroidism, unspecified: Secondary | ICD-10-CM | POA: Diagnosis not present

## 2020-09-29 DIAGNOSIS — H2511 Age-related nuclear cataract, right eye: Secondary | ICD-10-CM | POA: Diagnosis not present

## 2020-11-05 DIAGNOSIS — U071 COVID-19: Secondary | ICD-10-CM | POA: Diagnosis not present

## 2020-11-05 DIAGNOSIS — Z03818 Encounter for observation for suspected exposure to other biological agents ruled out: Secondary | ICD-10-CM | POA: Diagnosis not present

## 2020-11-05 DIAGNOSIS — J019 Acute sinusitis, unspecified: Secondary | ICD-10-CM | POA: Diagnosis not present

## 2021-02-16 DIAGNOSIS — L400 Psoriasis vulgaris: Secondary | ICD-10-CM | POA: Diagnosis not present

## 2021-02-16 DIAGNOSIS — L821 Other seborrheic keratosis: Secondary | ICD-10-CM | POA: Diagnosis not present

## 2021-03-06 DIAGNOSIS — E782 Mixed hyperlipidemia: Secondary | ICD-10-CM | POA: Diagnosis not present

## 2021-03-06 DIAGNOSIS — M810 Age-related osteoporosis without current pathological fracture: Secondary | ICD-10-CM | POA: Diagnosis not present

## 2021-03-06 DIAGNOSIS — E039 Hypothyroidism, unspecified: Secondary | ICD-10-CM | POA: Diagnosis not present

## 2021-03-15 DIAGNOSIS — E782 Mixed hyperlipidemia: Secondary | ICD-10-CM | POA: Diagnosis not present

## 2021-03-15 DIAGNOSIS — E6609 Other obesity due to excess calories: Secondary | ICD-10-CM | POA: Diagnosis not present

## 2021-03-15 DIAGNOSIS — E039 Hypothyroidism, unspecified: Secondary | ICD-10-CM | POA: Diagnosis not present

## 2021-04-03 DIAGNOSIS — M810 Age-related osteoporosis without current pathological fracture: Secondary | ICD-10-CM | POA: Diagnosis not present

## 2021-04-03 DIAGNOSIS — Z9889 Other specified postprocedural states: Secondary | ICD-10-CM | POA: Diagnosis not present

## 2021-04-08 ENCOUNTER — Encounter: Payer: Self-pay | Admitting: Emergency Medicine

## 2021-04-08 ENCOUNTER — Other Ambulatory Visit: Payer: Self-pay

## 2021-04-08 ENCOUNTER — Emergency Department
Admission: EM | Admit: 2021-04-08 | Discharge: 2021-04-08 | Disposition: A | Discharge status: Home / Self Care | Payer: PPO | Attending: Emergency Medicine | Admitting: Emergency Medicine

## 2021-04-08 DIAGNOSIS — L239 Allergic contact dermatitis, unspecified cause: Secondary | ICD-10-CM | POA: Diagnosis not present

## 2021-04-08 DIAGNOSIS — Z853 Personal history of malignant neoplasm of breast: Secondary | ICD-10-CM | POA: Diagnosis not present

## 2021-04-08 DIAGNOSIS — E039 Hypothyroidism, unspecified: Secondary | ICD-10-CM | POA: Insufficient documentation

## 2021-04-08 DIAGNOSIS — Z923 Personal history of irradiation: Secondary | ICD-10-CM | POA: Diagnosis not present

## 2021-04-08 DIAGNOSIS — Z79899 Other long term (current) drug therapy: Secondary | ICD-10-CM | POA: Insufficient documentation

## 2021-04-08 DIAGNOSIS — Z87891 Personal history of nicotine dependence: Secondary | ICD-10-CM | POA: Insufficient documentation

## 2021-04-08 DIAGNOSIS — Z9221 Personal history of antineoplastic chemotherapy: Secondary | ICD-10-CM | POA: Diagnosis not present

## 2021-04-08 DIAGNOSIS — R21 Rash and other nonspecific skin eruption: Secondary | ICD-10-CM | POA: Diagnosis present

## 2021-04-08 MED ORDER — METHYLPREDNISOLONE SODIUM SUCC 125 MG IJ SOLR
125.0000 mg | Freq: Once | INTRAMUSCULAR | Status: AC
  Administered 2021-04-08: 125 mg via INTRAMUSCULAR
  Filled 2021-04-08: qty 2

## 2021-04-08 NOTE — Discharge Instructions (Addendum)
You were seen today for a rash in the suprapubic area.  This is likely an allergic dermatitis.  You received a steroid injection in the ER.  Please start Zyrtec 10 mg and famotidine 10 mg daily at bedtime for the next 5 days.  If symptoms persist or worsen, please follow-up with your PCP.  You may want to consider allergy testing if you continue to have recurrent rashes like this.

## 2021-04-08 NOTE — ED Triage Notes (Signed)
Pt reports that she discovered a rash on her lower abd/groin area. She has been taking Benadryl for the itching. She has taken about 8 Benadryl today of the itching. She states that she gets it every summer in July or June. She thought maybe its a heat rash. She has no difficulty with breathing or swallowing.

## 2021-04-08 NOTE — ED Provider Notes (Addendum)
[facility] Emergency Department Provider Note ____________________________________________  Time seen: 1400  I have reviewed the triage vital signs and the nursing notes.  HISTORY  Chief Complaint  Allergic Reaction, Rash, and Pruritis   HPI Faith Garrett is a 75 y.o. female presents to the ER today with complaint of a rash to her suprapubic area.  She noticed this 1 week ago.  The rash itches.  It has not spread.  She denies vaginal discharge, odor or abnormal bleeding.  She denies urinary urgency, frequency dysuria or blood in her urine.  She denies abdominal pain, nausea, vomiting or diarrhea.  She has not been working out in the yard.  No one in her home has a similar rash.  She has no pets in the home.  She denies changes in soaps, lotions or detergents.  She has been taken Benadryl OTC with minimal relief of symptoms.  She reports this is the third time she has a similar rash but this time in a different area over the last 4 years.  She has never been allergy tested.  Past Medical History:  Diagnosis Date   Breast cancer ([place]) 2001   LT LUMPECTOMY   Gastric ulcer    H/O   GERD (gastroesophageal reflux disease)    Hyperlipidemia    Hypothyroidism    Personal history of chemotherapy    Personal history of radiation therapy    Radiation 2001   LT BREAST   S/P chemotherapy, time since greater than 12 weeks 2001   BREAST CA    There are no problems to display for this patient.   Past Surgical History:  Procedure Laterality Date   BREAST LUMPECTOMY Left    CARPAL TUNNEL RELEASE Bilateral    CHOLECYSTECTOMY     COLONOSCOPY     COLONOSCOPY WITH PROPOFOL N/A 10/14/2019   Procedure: COLONOSCOPY WITH PROPOFOL;  Surgeon: Toledo, Benay Pike, MD;  Location: [facility] ENDOSCOPY;  Service: Gastroenterology;  Laterality: N/A;   ESOPHAGOGASTRODUODENOSCOPY     ESOPHAGOGASTRODUODENOSCOPY (EGD) WITH PROPOFOL N/A 01/11/2016   Procedure: ESOPHAGOGASTRODUODENOSCOPY (EGD)  WITH PROPOFOL;  Surgeon: Hulen Luster, MD;  Location: [facility] ENDOSCOPY;  Service: Gastroenterology;  Laterality: N/A;   HEMORRHOID SURGERY     SHOULDER ARTHROSCOPY WITH OPEN ROTATOR CUFF REPAIR Right 05/26/2015   Procedure: SHOULDER ARTHROSCOPY WITH OPEN ROTATOR CUFF REPAIR;  Surgeon: Leanor Kail, MD;  Location: [facility];  Service: Orthopedics;  Laterality: Right;  subacromial decompression   WRIST FRACTURE SURGERY Left     Prior to Admission medications   Medication Sig Start Date End Date Taking? Authorizing Provider  Ascorbic Acid (VITAMIN C) 1000 MG tablet Take 2,000 mg by mouth daily.    [provider]  carboxymethylcellulose (REFRESH TEARS) 0.5 % SOLN 2 drops 3 (three) times daily as needed (for DRY EYES).    [provider]  cholecalciferol (VITAMIN D) 400 units TABS tablet Take 4,000 Units by mouth.    [provider]  clobetasol cream (TEMOVATE) 7.71 % Apply 1 application topically as needed.    [provider]  diphenhydrAMINE (SOMINEX) 25 MG tablet Take 25 mg by mouth at bedtime as needed for sleep. Reported on 01/24/2016    [provider]  ezetimibe (ZETIA) 10 MG tablet Take 10 mg by mouth daily.    [provider]  fexofenadine-pseudoephedrine (ALLEGRA-D) 60-120 MG per tablet Take 1 tablet by mouth as needed.    [provider]  fluticasone (FLONASE) 50 MCG/ACT nasal spray Place  2 sprays into both nostrils daily.    [provider]  levothyroxine (SYNTHROID) 112 MCG tablet Take 112 mcg by mouth daily before breakfast.    [provider]  lovastatin (MEVACOR) 20 MG tablet Take 20 mg by mouth daily. AM    [provider]  pantoprazole (PROTONIX) 40 MG tablet Take 40 mg by mouth daily.    [provider]  Potassium 99 MG TABS Take by mouth. AM    [provider]  pyridOXINE (VITAMIN B-6) 50 MG tablet Take 50 mg by mouth daily.    [provider]  sucralfate  (CARAFATE) 1 g tablet Take 1 tablet (1 g total) by mouth 4 (four) times daily. 12/06/15 12/05/16  [name], MD    Allergies Other and Codeine sulfate  Family History  Problem Relation Age of Onset   Colon cancer Mother    Heart attack Father    Alzheimer's disease Father     Social History Social History   Tobacco Use   Smoking status: Former    Packs/day: 0.50    Years: 3.00    Pack years: 1.50    Types: Cigarettes    Quit date: 10/28/1966    Years since quitting: 54.4   Smokeless tobacco: Never  Vaping Use   Vaping Use: Never used  Substance Use Topics   Alcohol use: No   Drug use: No    Review of Systems  Constitutional: Negative for fever, chills or body. Eyes: Negative for swelling around the eyes. ENT: Negative for swelling of the lips or tongue Cardiovascular: Negative for chest pain or chest tightness. Respiratory: Negative for cough or shortness of breath. Gastrointestinal: Negative for a nausea, vomiting and diarrhea. Genitourinary: Negative for urinary urgency, frequency, dysuria, blood in her urine, vaginal discharge, odor or abnormal uterine bleeding. Skin: Positive for rash in the vaginal area  ____________________________________________  PHYSICAL EXAM:  VITAL SIGNS: ED Triage Vitals [04/08/21 1316]  Enc Vitals Group     BP 133/90     Pulse Rate 87     Resp 18     Temp 97.8 F (36.6 C)     Temp Source Oral     SpO2 99 %     Weight 210 lb 1.6 oz (95.3 kg)     Height 5\' 6"  (1.676 m)     Head Circumference      Peak Flow      Pain Score 0     Pain Loc      Pain Edu?      Excl. in [place]?     Constitutional: Alert and oriented. Well appearing and in no distress. Head: Normocephalic. Eyes: Normal extraocular movements Mouth/Throat: No angioedema noted. Cardiovascular: Normal rate, regular rhythm.  Respiratory: Normal respiratory effort. No wheezes/rales/rhonchi. Gastrointestinal: Soft and nontender. No distention. Neurologic: No  gross focal neurologic deficits are appreciated. Skin:  Maculopapular rash noted in the suprapubic area.  INITIAL IMPRESSION / ASSESSMENT AND PLAN / ED COURSE  Allergic Dermatitis:  Solumedrol 125 mg IM x 1 Advised her to start Zyrtec 10 mg daily x 5 days Advised her to start Famotidine 10 mg daily x 5 days If reoccurs, would recommend  allergy testing, which can be ordered by her PCP ____________________________________________  FINAL CLINICAL IMPRESSION(S) / ED DIAGNOSES  Final diagnoses:  Allergic dermatitis      Jearld Fenton, NP 04/08/21 1421    Jearld Fenton, NP 04/08/21 1421    Vanessa Torrance, MD  04/09/21 1055  

## 2021-04-08 NOTE — ED Notes (Signed)
See triage note  Presents with rash to lower abd for about 1 week  States she usually gets this in the summer  States she has used Jock itch spray with some relief  And has been taking benadryl   Thinks she may have taken too much benadryl

## 2021-07-17 DIAGNOSIS — K7581 Nonalcoholic steatohepatitis (NASH): Secondary | ICD-10-CM | POA: Diagnosis not present

## 2021-07-17 DIAGNOSIS — K219 Gastro-esophageal reflux disease without esophagitis: Secondary | ICD-10-CM | POA: Diagnosis not present

## 2021-07-17 DIAGNOSIS — Z86010 Personal history of colonic polyps: Secondary | ICD-10-CM | POA: Diagnosis not present

## 2021-07-17 DIAGNOSIS — Z8711 Personal history of peptic ulcer disease: Secondary | ICD-10-CM | POA: Diagnosis not present

## 2021-07-17 DIAGNOSIS — E669 Obesity, unspecified: Secondary | ICD-10-CM | POA: Diagnosis not present

## 2021-07-17 DIAGNOSIS — Z800 Family history of malignant neoplasm of digestive organs: Secondary | ICD-10-CM | POA: Diagnosis not present

## 2021-07-24 DIAGNOSIS — E782 Mixed hyperlipidemia: Secondary | ICD-10-CM | POA: Diagnosis not present

## 2021-07-24 DIAGNOSIS — E039 Hypothyroidism, unspecified: Secondary | ICD-10-CM | POA: Diagnosis not present

## 2021-07-31 DIAGNOSIS — E6609 Other obesity due to excess calories: Secondary | ICD-10-CM | POA: Diagnosis not present

## 2021-07-31 DIAGNOSIS — E782 Mixed hyperlipidemia: Secondary | ICD-10-CM | POA: Diagnosis not present

## 2021-07-31 DIAGNOSIS — Z0000 Encounter for general adult medical examination without abnormal findings: Secondary | ICD-10-CM | POA: Diagnosis not present

## 2021-07-31 DIAGNOSIS — E039 Hypothyroidism, unspecified: Secondary | ICD-10-CM | POA: Diagnosis not present

## 2022-01-10 DIAGNOSIS — R509 Fever, unspecified: Secondary | ICD-10-CM | POA: Diagnosis not present

## 2022-01-10 DIAGNOSIS — R399 Unspecified symptoms and signs involving the genitourinary system: Secondary | ICD-10-CM | POA: Diagnosis not present

## 2022-01-10 DIAGNOSIS — Z03818 Encounter for observation for suspected exposure to other biological agents ruled out: Secondary | ICD-10-CM | POA: Diagnosis not present

## 2022-01-22 DIAGNOSIS — E039 Hypothyroidism, unspecified: Secondary | ICD-10-CM | POA: Diagnosis not present

## 2022-01-22 DIAGNOSIS — E782 Mixed hyperlipidemia: Secondary | ICD-10-CM | POA: Diagnosis not present

## 2022-01-25 DIAGNOSIS — E039 Hypothyroidism, unspecified: Secondary | ICD-10-CM | POA: Diagnosis not present

## 2022-01-25 DIAGNOSIS — E6609 Other obesity due to excess calories: Secondary | ICD-10-CM | POA: Diagnosis not present

## 2022-01-25 DIAGNOSIS — E782 Mixed hyperlipidemia: Secondary | ICD-10-CM | POA: Diagnosis not present

## 2022-05-14 DIAGNOSIS — M810 Age-related osteoporosis without current pathological fracture: Secondary | ICD-10-CM | POA: Diagnosis not present

## 2022-05-17 ENCOUNTER — Emergency Department: Payer: PPO

## 2022-05-17 ENCOUNTER — Emergency Department
Admission: EM | Admit: 2022-05-17 | Discharge: 2022-05-17 | Disposition: A | Discharge status: Home / Self Care | Payer: PPO | Attending: Emergency Medicine | Admitting: Emergency Medicine

## 2022-05-17 DIAGNOSIS — R55 Syncope and collapse: Secondary | ICD-10-CM | POA: Diagnosis not present

## 2022-05-17 DIAGNOSIS — R61 Generalized hyperhidrosis: Secondary | ICD-10-CM | POA: Diagnosis not present

## 2022-05-17 DIAGNOSIS — R110 Nausea: Secondary | ICD-10-CM | POA: Diagnosis not present

## 2022-05-17 DIAGNOSIS — D3500 Benign neoplasm of unspecified adrenal gland: Secondary | ICD-10-CM | POA: Diagnosis not present

## 2022-05-17 DIAGNOSIS — E039 Hypothyroidism, unspecified: Secondary | ICD-10-CM | POA: Insufficient documentation

## 2022-05-17 DIAGNOSIS — Z853 Personal history of malignant neoplasm of breast: Secondary | ICD-10-CM | POA: Insufficient documentation

## 2022-05-17 DIAGNOSIS — R4020 Unspecified coma: Secondary | ICD-10-CM | POA: Diagnosis not present

## 2022-05-17 DIAGNOSIS — Z9049 Acquired absence of other specified parts of digestive tract: Secondary | ICD-10-CM | POA: Diagnosis not present

## 2022-05-17 DIAGNOSIS — R1013 Epigastric pain: Secondary | ICD-10-CM | POA: Insufficient documentation

## 2022-05-17 DIAGNOSIS — R1084 Generalized abdominal pain: Secondary | ICD-10-CM | POA: Diagnosis not present

## 2022-05-17 DIAGNOSIS — W19XXXA Unspecified fall, initial encounter: Secondary | ICD-10-CM | POA: Diagnosis not present

## 2022-05-17 DIAGNOSIS — M549 Dorsalgia, unspecified: Secondary | ICD-10-CM | POA: Diagnosis not present

## 2022-05-17 DIAGNOSIS — M545 Low back pain, unspecified: Secondary | ICD-10-CM | POA: Diagnosis not present

## 2022-05-17 DIAGNOSIS — R404 Transient alteration of awareness: Secondary | ICD-10-CM | POA: Diagnosis not present

## 2022-05-17 DIAGNOSIS — I700 Atherosclerosis of aorta: Secondary | ICD-10-CM | POA: Diagnosis not present

## 2022-05-17 LAB — HEPATIC FUNCTION PANEL
ALT: 58 U/L — ABNORMAL HIGH (ref 0–44)
AST: 52 U/L — ABNORMAL HIGH (ref 15–41)
Albumin: 4.1 g/dL (ref 3.5–5.0)
Alkaline Phosphatase: 105 U/L (ref 38–126)
Bilirubin, Direct: 0.2 mg/dL (ref 0.0–0.2)
Indirect Bilirubin: 0.6 mg/dL (ref 0.3–0.9)
Total Bilirubin: 0.8 mg/dL (ref 0.3–1.2)
Total Protein: 7.7 g/dL (ref 6.5–8.1)

## 2022-05-17 LAB — BASIC METABOLIC PANEL
Anion gap: 9 (ref 5–15)
BUN: 19 mg/dL (ref 8–23)
CO2: 23 mmol/L (ref 22–32)
Calcium: 9.3 mg/dL (ref 8.9–10.3)
Chloride: 106 mmol/L (ref 98–111)
Creatinine, Ser: 0.76 mg/dL (ref 0.44–1.00)
GFR, Estimated: >60 mL/min (ref >60)
Glucose, Bld: 125 mg/dL — ABNORMAL HIGH (ref 70–99)
Potassium: 4.1 mmol/L (ref 3.5–5.1)
Sodium: 138 mmol/L (ref 135–145)

## 2022-05-17 LAB — LACTIC ACID, PLASMA: Lactic Acid, Venous: 1.6 mmol/L (ref 0.5–1.9)

## 2022-05-17 LAB — LIPASE, BLOOD: Lipase: 32 U/L (ref 11–51)

## 2022-05-17 LAB — CBC
HCT: 42.2 % (ref 36.0–46.0)
Hemoglobin: 13.6 g/dL (ref 12.0–15.0)
MCH: 29.8 pg (ref 26.0–34.0)
MCHC: 32.2 g/dL (ref 30.0–36.0)
MCV: 92.3 fL (ref 80.0–100.0)
Platelets: 348 10*3/uL (ref 150–400)
RBC: 4.57 MIL/uL (ref 3.87–5.11)
RDW: 12.5 % (ref 11.5–15.5)
WBC: 7.9 10*3/uL (ref 4.0–10.5)
nRBC: 0 % (ref 0.0–0.2)

## 2022-05-17 LAB — URINALYSIS, ROUTINE W REFLEX MICROSCOPIC
Bilirubin Urine: NEGATIVE
Glucose, UA: NEGATIVE mg/dL
Hgb urine dipstick: NEGATIVE
Ketones, ur: NEGATIVE mg/dL
Leukocytes,Ua: NEGATIVE
Nitrite: NEGATIVE
Protein, ur: NEGATIVE mg/dL
Specific Gravity, Urine: 1.024 (ref 1.005–1.030)
pH: 7 (ref 5.0–8.0)

## 2022-05-17 LAB — TROPONIN I (HIGH SENSITIVITY)
Troponin I (High Sensitivity): 6 ng/L (ref <18)
Troponin I (High Sensitivity): 6 ng/L (ref <18)

## 2022-05-17 MED ORDER — SODIUM CHLORIDE 0.9 % IV BOLUS
1000.0000 mL | Freq: Once | INTRAVENOUS | Status: AC
  Administered 2022-05-17: 1000 mL via INTRAVENOUS

## 2022-05-17 MED ORDER — IOHEXOL 350 MG/ML SOLN
100.0000 mL | Freq: Once | INTRAVENOUS | Status: AC | PRN
  Administered 2022-05-17: 100 mL via INTRAVENOUS

## 2022-05-17 NOTE — ED Provider Notes (Signed)
[facility] Provider Note    Event Date/Time   First MD Initiated Contact with Patient 05/17/22 1247     (approximate)   History   Back Pain   HPI  Faith Garrett is a 76 y.o. female with history of GERD, hyperlipidemia, hypothyroidism, and breast cancer who presents after syncopal episode with abdominal and back pain.  The patient states that she was eating a salad when she started to feel lightheaded, nauseous, and became diaphoretic.  She went to the bathroom and subsequently passed out and was found unresponsive.  Since then she has awoken.  She reports nausea, epigastric abdominal pain and some lower back pain all of which have improved with fentanyl and Zofran.  She denies any chest pain or shortness of breath.  She has no diarrhea.  She states she was feeling fine earlier.     Physical Exam   Triage Vital Signs: ED Triage Vitals  Enc Vitals Group     BP 05/17/22 1254 (!) 117/58     Pulse Rate 05/17/22 1254 70     Resp 05/17/22 1254 18     Temp 05/17/22 1254 (!) 97.4 F (36.3 C)     Temp Source 05/17/22 1254 Oral     SpO2 05/17/22 1254 (!) 87 %     Weight 05/17/22 1252 214 lb (97.1 kg)     Height --      Head Circumference --      Peak Flow --      Pain Score 05/17/22 1252 2     Pain Loc --      Pain Edu? --      Excl. in [place]? --     Most recent vital signs: Vitals:   05/17/22 1345 05/17/22 1400  BP:  (!) 123/50  Pulse: 71 70  Resp: 16 12  Temp:    SpO2: 100% 100%     General: Alert and oriented, somewhat uncomfortable and diaphoretic appearing but in no acute distress. CV:  Good peripheral perfusion.  Normal heart sounds. Resp:  Normal effort.  Lungs CTAB. Abd:  No distention.  Soft and nontender. Other:  No peripheral edema.  No scleral icterus.   ED Results / Procedures / Treatments   Labs (all labs ordered are listed, but only abnormal results are displayed) Labs Reviewed  BASIC METABOLIC PANEL - Abnormal; Notable for  the following components:      Result Value   Glucose, Bld 125 (*)    All other components within normal limits  HEPATIC FUNCTION PANEL - Abnormal; Notable for the following components:   AST 52 (*)    ALT 58 (*)    All other components within normal limits  CBC  LIPASE, BLOOD  LACTIC ACID, PLASMA  URINALYSIS, ROUTINE W REFLEX MICROSCOPIC  TROPONIN I (HIGH SENSITIVITY)  TROPONIN I (HIGH SENSITIVITY)     EKG  ED ECG REPORT I, Arta Silence, the attending physician, personally viewed and interpreted this ECG.  Date: 05/17/2022 EKG Time: 1255 Rate: 70 Rhythm: normal sinus rhythm QRS Axis: normal Intervals: normal ST/T Wave abnormalities: normal Narrative Interpretation: no evidence of acute ischemia    RADIOLOGY  Chest x-ray: I independently viewed and interpreted the images; there is no focal consolidation or edema  CT angio chest/abdomen/pelvis: No evidence of aortic dissection or aneurysm.  No acute abnormalities.  PROCEDURES:  Critical Care performed: No  Procedures   MEDICATIONS ORDERED IN ED: Medications  sodium chloride 0.9 % bolus 1,000  mL (0 mLs Intravenous Stopped 05/17/22 1515)  iohexol (OMNIPAQUE) 350 MG/ML injection 100 mL (100 mLs Intravenous Contrast Given 05/17/22 1442)     IMPRESSION / MDM / ASSESSMENT AND PLAN / ED COURSE  I reviewed the triage vital signs and the nursing notes.  76 year old female with PMH as noted above presents with an episode of syncope associated with nausea, diaphoresis, abdominal and back pain.  I reviewed the past medical records.  The patient has no recent admissions.  She was last seen by her PMD in March and is being treated for hyperlipidemia.  She has no prior cardiac history.  Differential diagnosis includes, but is not limited to, vasovagal syncope, gastroenteritis, gastritis, foodborne illness, metabolic disturbance, less likely aortic dissection or aneurysm rupture, or ACS.  Although the patient's  diaphoresis and locations of the pain are somewhat concerning for aortic pathology, her vital signs are normal, she does not have a significant difference in blood pressures between arms, and she is not having any chest pain.  We will give fluids and obtain lab work-up including cardiac enzymes as well as a CT angio of the chest, abdomen, and pelvis.  Patient's presentation is most consistent with acute presentation with potential threat to life or bodily function.  The patient is on the cardiac monitor to evaluate for evidence of arrhythmia and/or significant heart rate changes.  ----------------------------------------- 3:21 PM on 05/17/2022 -----------------------------------------  The work-up so far is entirely negative.  The patient has normal electrolytes, no leukocytosis, a negative lactate, and normal troponins x2.  Her vital signs are stable.  On reassessment, the patient appears well and states she is feeling much better.  Overall presentation is most consistent with vasovagal episode or other benign etiology given that she had a prodrome of lightheadedness and nausea.  I did consider the possibility of admission given the patient's age, however the patient states that she would prefer to go home if possible.  Given the entirely negative work-up I think that this is reasonable.  We will obtain orthostatic vital signs, do a p.o. challenge, and obtain a urinalysis.  Plan will be discharged home if these are reassuring.   FINAL CLINICAL IMPRESSION(S) / ED DIAGNOSES   Final diagnoses:  Syncope, unspecified syncope type     Rx / DC Orders   ED Discharge Orders     None        Note:  This document was prepared using Dragon voice recognition software and may include unintentional dictation errors.    Arta Silence, MD 05/17/22 1525

## 2022-05-17 NOTE — ED Notes (Signed)
ED Provider at bedside. 

## 2022-05-17 NOTE — ED Notes (Signed)
79ORTHOSTATICS Laying HR 75, BP 136/82 Sitting HR 75, BP 136/81 Standing HR , BP 154/104

## 2022-05-17 NOTE — ED Triage Notes (Signed)
Pt arrived via EMS. Pt was found in a bathroom at a local restaurant unresponsive. Pt complaints of severe back pain with pale and cool to touch skin. Pt received 53mg of fentanyl and '4mg'$  of zofran via 18g in [place] Pt is A/Ox4, NAD, BGL 126 by EMS

## 2022-05-20 DIAGNOSIS — Z9889 Other specified postprocedural states: Secondary | ICD-10-CM | POA: Diagnosis not present

## 2022-05-20 DIAGNOSIS — E890 Postprocedural hypothyroidism: Secondary | ICD-10-CM | POA: Diagnosis not present

## 2022-05-20 DIAGNOSIS — M810 Age-related osteoporosis without current pathological fracture: Secondary | ICD-10-CM | POA: Diagnosis not present

## 2022-06-21 DIAGNOSIS — E890 Postprocedural hypothyroidism: Secondary | ICD-10-CM | POA: Diagnosis not present

## 2022-08-06 DIAGNOSIS — E782 Mixed hyperlipidemia: Secondary | ICD-10-CM | POA: Diagnosis not present

## 2022-08-06 DIAGNOSIS — E039 Hypothyroidism, unspecified: Secondary | ICD-10-CM | POA: Diagnosis not present

## 2022-08-13 DIAGNOSIS — E782 Mixed hyperlipidemia: Secondary | ICD-10-CM | POA: Diagnosis not present

## 2022-08-13 DIAGNOSIS — E039 Hypothyroidism, unspecified: Secondary | ICD-10-CM | POA: Diagnosis not present

## 2022-08-13 DIAGNOSIS — E6609 Other obesity due to excess calories: Secondary | ICD-10-CM | POA: Diagnosis not present

## 2022-08-13 DIAGNOSIS — Z0000 Encounter for general adult medical examination without abnormal findings: Secondary | ICD-10-CM | POA: Diagnosis not present

## 2023-01-10 DIAGNOSIS — Z86010 Personal history of colonic polyps: Secondary | ICD-10-CM | POA: Diagnosis not present

## 2023-01-10 DIAGNOSIS — K219 Gastro-esophageal reflux disease without esophagitis: Secondary | ICD-10-CM | POA: Diagnosis not present

## 2023-01-10 DIAGNOSIS — K7581 Nonalcoholic steatohepatitis (NASH): Secondary | ICD-10-CM | POA: Diagnosis not present

## 2023-01-10 DIAGNOSIS — R7401 Elevation of levels of liver transaminase levels: Secondary | ICD-10-CM | POA: Diagnosis not present

## 2023-02-07 DIAGNOSIS — H01135 Eczematous dermatitis of left lower eyelid: Secondary | ICD-10-CM | POA: Diagnosis not present

## 2023-02-07 DIAGNOSIS — L400 Psoriasis vulgaris: Secondary | ICD-10-CM | POA: Diagnosis not present

## 2023-04-01 DIAGNOSIS — E039 Hypothyroidism, unspecified: Secondary | ICD-10-CM | POA: Diagnosis not present

## 2023-04-01 DIAGNOSIS — E782 Mixed hyperlipidemia: Secondary | ICD-10-CM | POA: Diagnosis not present

## 2023-04-04 ENCOUNTER — Ambulatory Visit: Payer: PPO

## 2023-04-04 DIAGNOSIS — Z86010 Personal history of colonic polyps: Secondary | ICD-10-CM | POA: Diagnosis not present

## 2023-04-04 DIAGNOSIS — D120 Benign neoplasm of cecum: Secondary | ICD-10-CM | POA: Diagnosis not present

## 2023-04-04 DIAGNOSIS — Z1211 Encounter for screening for malignant neoplasm of colon: Secondary | ICD-10-CM | POA: Diagnosis not present

## 2023-04-04 DIAGNOSIS — K640 First degree hemorrhoids: Secondary | ICD-10-CM | POA: Diagnosis not present

## 2023-04-04 DIAGNOSIS — D126 Benign neoplasm of colon, unspecified: Secondary | ICD-10-CM | POA: Diagnosis not present

## 2023-04-04 DIAGNOSIS — D123 Benign neoplasm of transverse colon: Secondary | ICD-10-CM | POA: Diagnosis not present

## 2023-04-08 DIAGNOSIS — E6609 Other obesity due to excess calories: Secondary | ICD-10-CM | POA: Diagnosis not present

## 2023-04-08 DIAGNOSIS — L740 Miliaria rubra: Secondary | ICD-10-CM | POA: Diagnosis not present

## 2023-04-08 DIAGNOSIS — R7401 Elevation of levels of liver transaminase levels: Secondary | ICD-10-CM | POA: Diagnosis not present

## 2023-04-08 DIAGNOSIS — E782 Mixed hyperlipidemia: Secondary | ICD-10-CM | POA: Diagnosis not present

## 2023-04-08 DIAGNOSIS — E039 Hypothyroidism, unspecified: Secondary | ICD-10-CM | POA: Diagnosis not present

## 2023-04-15 DIAGNOSIS — H2513 Age-related nuclear cataract, bilateral: Secondary | ICD-10-CM | POA: Diagnosis not present

## 2023-05-22 DIAGNOSIS — M810 Age-related osteoporosis without current pathological fracture: Secondary | ICD-10-CM | POA: Diagnosis not present

## 2023-10-08 ENCOUNTER — Other Ambulatory Visit: Payer: Self-pay

## 2023-10-08 ENCOUNTER — Emergency Department: Payer: PPO

## 2023-10-08 ENCOUNTER — Emergency Department
Admission: EM | Admit: 2023-10-08 | Discharge: 2023-10-08 | Disposition: A | Discharge status: Home / Self Care | Payer: PPO | Attending: Emergency Medicine | Admitting: Emergency Medicine

## 2023-10-08 DIAGNOSIS — R0789 Other chest pain: Secondary | ICD-10-CM | POA: Insufficient documentation

## 2023-10-08 DIAGNOSIS — M898X8 Other specified disorders of bone, other site: Secondary | ICD-10-CM | POA: Diagnosis not present

## 2023-10-08 DIAGNOSIS — M19012 Primary osteoarthritis, left shoulder: Secondary | ICD-10-CM | POA: Diagnosis not present

## 2023-10-08 DIAGNOSIS — I771 Stricture of artery: Secondary | ICD-10-CM | POA: Diagnosis not present

## 2023-10-08 DIAGNOSIS — R0781 Pleurodynia: Secondary | ICD-10-CM | POA: Diagnosis not present

## 2023-10-08 DIAGNOSIS — M25512 Pain in left shoulder: Secondary | ICD-10-CM | POA: Insufficient documentation

## 2023-10-08 MED ORDER — HYDROCODONE-ACETAMINOPHEN 5-325 MG PO TABS
1.0000 | ORAL_TABLET | Freq: Once | ORAL | Status: AC
  Administered 2023-10-08: 1 via ORAL
  Filled 2023-10-08: qty 1

## 2023-10-08 MED ORDER — HYDROCODONE-ACETAMINOPHEN 5-325 MG PO TABS
1.0000 | ORAL_TABLET | Freq: Four times a day (QID) | ORAL | 0 refills | Status: AC | PRN
Start: 2023-10-08 — End: 2023-10-11

## 2023-10-08 MED ORDER — HYDROCODONE-ACETAMINOPHEN 5-325 MG PO TABS: 1.0000 | ORAL_TABLET | Freq: Four times a day (QID) | ORAL | 0 refills | Status: DC | PRN

## 2023-10-08 MED ORDER — LIDOCAINE 5 % EX PTCH
1.0000 | MEDICATED_PATCH | CUTANEOUS | Status: DC
  Administered 2023-10-08: 1 via TRANSDERMAL
  Filled 2023-10-08: qty 1

## 2023-10-08 NOTE — Discharge Instructions (Signed)
You were seen in the emergency department today for evaluation of pain in your shoulder and chest wall.  Your x-rays did not show an obvious fracture.  Please follow-up with your primary care doctor or orthopedics as needed for further evaluation.  You can take Tylenol and use heat or over-the-counter lidocaine patches to help with your pain.  If you have breakthrough pain, I sent a short course of narcotic medicine to your pharmacy.  Return to the ER for any new or worsening symptoms.

## 2023-10-08 NOTE — ED Notes (Signed)
See triage notes. Patient c/o left shoulder pain since 12/2. Patient stated her husband tripped into her causing her to fall into the wall hard. Patient stated the pain has started to travel into her chest down to under her breast on the same side.

## 2023-10-08 NOTE — ED Triage Notes (Signed)
Pt to ED for left shoulder injury on 12/2. Reports thinks tore pectoral muscle. Denies having imaging or going to doctor since.

## 2023-10-08 NOTE — ED Provider Notes (Signed)
[facility] Provider Note    Event Date/Time   First MD Initiated Contact with Patient 10/08/23 [phone]     (approximate)   History   Shoulder Injury   HPI  Faith Garrett is a 77 year old female presenting to the emergency department for evaluation of left shoulder and chest wall pain.  On 12/2, patient fell into the wall with her left shoulder and side.  Since that time, she has had worsening pain over the area.  Feels like she strained her muscles and is concerned about a possible muscle tear.  Has not seen a doctor.  Has taken Tylenol at home with limited benefit.  Denies any deeper chest pain, shortness of breath, injuries to other areas.    Physical Exam   Triage Vital Signs: ED Triage Vitals  Encounter Vitals Group     BP 10/08/23 0944 (!) 164/85     Systolic BP Percentile --      Diastolic BP Percentile --      Pulse Rate 10/08/23 0944 93     Resp 10/08/23 0944 17     Temp 10/08/23 0944 97.7 F (36.5 C)     Temp Source 10/08/23 0944 Oral     SpO2 10/08/23 0944 96 %     Weight 10/08/23 0942 200 lb (90.7 kg)     Height 10/08/23 0942 5\' 6"  (1.676 m)     Head Circumference --      Peak Flow --      Pain Score 10/08/23 0942 10     Pain Loc --      Pain Education --      Exclude from Growth Chart --     Most recent vital signs: Vitals:   10/08/23 0944  BP: (!) 164/85  Pulse: 93  Resp: 17  Temp: 97.7 F (36.5 C)  SpO2: 96%     General: Awake, interactive  CV:  Regular rate, good peripheral perfusion.  Chest wall: Reproducible tenderness palpation over the left anterolateral chest wall without overlying skin changes Resp:  Unlabored respiration, lungs clear to auscultation Abd:  Nondistended.  Neuro:  Symmetric facial movement, fluid speech MSK:  Full range of motion of left shoulder without significant tenderness palpation over the shoulder joint   ED Results / Procedures / Treatments   Labs (all labs ordered are listed, but  only abnormal results are displayed) Labs Reviewed - No data to display   EKG EKG independently reviewed interpreted by myself (ER attending) demonstrates:    RADIOLOGY Imaging independently reviewed and interpreted by myself demonstrates:  Shoulder x-Reyne Falconi without acute fracture or dislocation of the shoulder, questionable fourth rib fracture noted by radiology Chest x-Sherrel Shafer and rib series without obvious fracture  PROCEDURES:  Critical Care performed: No  Procedures   MEDICATIONS ORDERED IN ED: Medications  lidocaine (LIDODERM) 5 % 1 patch (1 patch Transdermal Patch Applied 10/08/23 1056)  HYDROcodone-acetaminophen (NORCO/VICODIN) 5-325 MG per tablet 1 tablet (1 tablet Oral Given 10/08/23 1055)     IMPRESSION / MDM / ASSESSMENT AND PLAN / ED COURSE  I reviewed the triage vital signs and the nursing notes.  Differential diagnosis includes, but is not limited to, fracture, dislocation, soft tissue injury, clinical history not suggestive of ACS, or acute cardiopulmonary process  Patient's presentation is most consistent with acute complicated illness / injury requiring diagnostic workup.  77 year old female presenting to the emergency department for evaluation of chest wall pain after recent fall.  X-Valentine Barney without obvious rib  fracture, but did discuss limited sensitivity of x-Jaianna Nicoll for detection of rib fractures.  Patient was given pain control with Norco and lidocaine patch.  Following this, she was able to pull 2000 on incentive spirometer without significant discomfort.  Discussed discharge with outpatient follow-up.  Patient is comfortable this plan.  Will DC with short course of pain medicine.  Strict return precautions provided.  Patient discharged in stable condition.      FINAL CLINICAL IMPRESSION(S) / ED DIAGNOSES   Final diagnoses:  Chest wall pain  Acute pain of left shoulder     Rx / DC Orders   ED Discharge Orders          Ordered    HYDROcodone-acetaminophen  (NORCO) 5-325 MG tablet  Every 6 hours PRN        10/08/23 1241             Note:  This document was prepared using Dragon voice recognition software and may include unintentional dictation errors.   Trinna Post, MD 10/08/23 [phone]

## 2023-10-13 DIAGNOSIS — E782 Mixed hyperlipidemia: Secondary | ICD-10-CM | POA: Diagnosis not present

## 2023-10-13 DIAGNOSIS — R7401 Elevation of levels of liver transaminase levels: Secondary | ICD-10-CM | POA: Diagnosis not present

## 2023-10-13 DIAGNOSIS — E039 Hypothyroidism, unspecified: Secondary | ICD-10-CM | POA: Diagnosis not present

## 2023-10-16 DIAGNOSIS — K047 Periapical abscess without sinus: Secondary | ICD-10-CM | POA: Diagnosis not present

## 2023-10-16 DIAGNOSIS — Z0000 Encounter for general adult medical examination without abnormal findings: Secondary | ICD-10-CM | POA: Diagnosis not present

## 2023-10-16 DIAGNOSIS — K760 Fatty (change of) liver, not elsewhere classified: Secondary | ICD-10-CM | POA: Diagnosis not present

## 2023-10-16 DIAGNOSIS — E039 Hypothyroidism, unspecified: Secondary | ICD-10-CM | POA: Diagnosis not present

## 2023-10-16 DIAGNOSIS — E782 Mixed hyperlipidemia: Secondary | ICD-10-CM | POA: Diagnosis not present

## 2023-10-16 DIAGNOSIS — E6609 Other obesity due to excess calories: Secondary | ICD-10-CM | POA: Diagnosis not present

## 2023-12-26 DIAGNOSIS — E039 Hypothyroidism, unspecified: Secondary | ICD-10-CM | POA: Diagnosis not present

## 2024-01-30 DIAGNOSIS — R7401 Elevation of levels of liver transaminase levels: Secondary | ICD-10-CM | POA: Diagnosis not present

## 2024-01-30 DIAGNOSIS — Z860101 Personal history of adenomatous and serrated colon polyps: Secondary | ICD-10-CM | POA: Diagnosis not present

## 2024-01-30 DIAGNOSIS — R635 Abnormal weight gain: Secondary | ICD-10-CM | POA: Diagnosis not present

## 2024-01-30 DIAGNOSIS — E8881 Metabolic syndrome: Secondary | ICD-10-CM | POA: Diagnosis not present

## 2024-01-30 DIAGNOSIS — K7581 Nonalcoholic steatohepatitis (NASH): Secondary | ICD-10-CM | POA: Diagnosis not present

## 2024-01-30 DIAGNOSIS — K219 Gastro-esophageal reflux disease without esophagitis: Secondary | ICD-10-CM | POA: Diagnosis not present

## 2024-04-15 DIAGNOSIS — E782 Mixed hyperlipidemia: Secondary | ICD-10-CM | POA: Diagnosis not present

## 2024-04-22 DIAGNOSIS — E782 Mixed hyperlipidemia: Secondary | ICD-10-CM | POA: Diagnosis not present

## 2024-04-22 DIAGNOSIS — Z1331 Encounter for screening for depression: Secondary | ICD-10-CM | POA: Diagnosis not present

## 2024-04-22 DIAGNOSIS — E039 Hypothyroidism, unspecified: Secondary | ICD-10-CM | POA: Diagnosis not present

## 2024-04-22 DIAGNOSIS — Z0000 Encounter for general adult medical examination without abnormal findings: Secondary | ICD-10-CM | POA: Diagnosis not present

## 2024-04-22 DIAGNOSIS — E6609 Other obesity due to excess calories: Secondary | ICD-10-CM | POA: Diagnosis not present

## 2024-05-20 DIAGNOSIS — M810 Age-related osteoporosis without current pathological fracture: Secondary | ICD-10-CM | POA: Diagnosis not present

## 2024-05-27 DIAGNOSIS — M810 Age-related osteoporosis without current pathological fracture: Secondary | ICD-10-CM | POA: Diagnosis not present

## 2024-05-27 DIAGNOSIS — E559 Vitamin D deficiency, unspecified: Secondary | ICD-10-CM | POA: Diagnosis not present

## 2024-07-11 DIAGNOSIS — N390 Urinary tract infection, site not specified: Secondary | ICD-10-CM | POA: Diagnosis not present
